# Patient Record
Sex: Male | Born: 1965 | Race: White | Hispanic: No | Marital: Married | Smoking: Never smoker
Health system: Southern US, Community
[De-identification: ages and names within clinical notes are randomized; demographics above are authoritative.]

---

## 2017-06-05 DIAGNOSIS — Z13220 Encounter for screening for lipoid disorders: Secondary | ICD-10-CM | POA: Diagnosis not present

## 2017-06-05 DIAGNOSIS — Z0000 Encounter for general adult medical examination without abnormal findings: Secondary | ICD-10-CM | POA: Diagnosis not present

## 2018-07-09 DIAGNOSIS — D225 Melanocytic nevi of trunk: Secondary | ICD-10-CM | POA: Diagnosis not present

## 2018-07-09 DIAGNOSIS — L711 Rhinophyma: Secondary | ICD-10-CM | POA: Diagnosis not present

## 2018-07-09 DIAGNOSIS — L723 Sebaceous cyst: Secondary | ICD-10-CM | POA: Diagnosis not present

## 2020-08-05 NOTE — Progress Notes (Signed)
Carl [name] [address] [place] Phone: [phone] Subjective:   I Carl [name] am serving as a Education administrator for Dr. Hulan [name].  This visit occurred during the SARS-CoV-2 public health emergency.  Safety protocols were in place, including screening questions prior to the visit, additional usage of staff PPE, and extensive cleaning of exam room while observing appropriate contact time as indicated for disinfecting solutions.   I'm seeing this patient by the request  of:  Carl Arabian, MD  CC: Low back pain  [id number]  Carl [name] is a 54 y.o. male coming in with complaint of low back pain with sciatica. Patient states he has left lower back pain. Is taking 300 mg of gabapentin 2 times a day. This past weekend he went to a wedding and states that Sunday he was in pain. Monday he could not move and moving his left leg caused sharp pain. Back is stiff. Sciatic pain is chronic. Bilateral but worse on the left. Has tried ice, bio freeze, Ibuprofen, aleve 220 2 times a day. 9/10 at its worse.       Social History   Socioeconomic History  . Marital status: Married    Spouse name: Not on file  . Number of children: Not on file  . Years of education: Not on file  . Highest education level: Not on file  Occupational History  . Not on file  Tobacco Use  . Smoking status: Not on file  Substance and Sexual Activity  . Alcohol use: Not on file  . Drug use: Not on file  . Sexual activity: Not on file  Other Topics Concern  . Not on file  Social History Narrative  . Not on file   Social Determinants of Health   Financial Resource Strain:   . Difficulty of Paying Living Expenses: Not on file  Food Insecurity:   . Worried About Charity fundraiser in the Last Year: Not on file  . Ran Out of Food in the Last Year: Not on file  Transportation Needs:   . Lack of Transportation (Medical): Not on file  . Lack of Transportation  (Non-Medical): Not on file  Physical Activity:   . Days of Exercise per Week: Not on file  . Minutes of Exercise per Session: Not on file  Stress:   . Feeling of Stress : Not on file  Social Connections:   . Frequency of Communication with Friends and Family: Not on file  . Frequency of Social Gatherings with Friends and Family: Not on file  . Attends Religious Services: Not on file  . Active Member of Clubs or Organizations: Not on file  . Attends Archivist Meetings: Not on file  . Marital Status: Not on file   Not on File No family history on file.       Current Outpatient Medications (Other):  .  tiZANidine (ZANAFLEX) 4 MG tablet, Take 1 tablet (4 mg total) by mouth Nightly for 10 days.   Reviewed prior external information including notes and imaging from  primary care provider As well as notes that were available from care everywhere and other healthcare systems.  Past medical history, social, surgical and family history all reviewed in electronic medical record.  No pertanent information unless stated regarding to the chief complaint.   Review of Systems:  No headache, visual changes, nausea, vomiting, diarrhea, constipation, dizziness, abdominal pain, skin rash, fevers, chills, night sweats, weight loss, swollen  lymph nodes, body aches, joint swelling, chest pain, shortness of breath, mood changes. POSITIVE muscle aches  Objective  Blood pressure 140/80, pulse 75, height 5\' 9"  (1.753 m), weight 181 lb (82.1 kg), SpO2 98 %.   General: No apparent distress alert and oriented x3 mood and affect normal, dressed appropriately.  HEENT: Pupils equal, extraocular movements intact  Respiratory: Patient's speak in full sentences and does not appear short of breath  Cardiovascular: No lower extremity edema, non tender, no erythema  Gait normal with good balance and coordination.  MSK:  Non tender with full range of motion and good stability and symmetric strength and  tone of shoulders, elbows, wrist, hip, knee and ankles bilaterally.  Low back exam shows the patient has significant loss of lordosis.  Moderate to severe tenderness to palpation over the left sacroiliac joint.  Positive Corky Sox.  Significant tightness noted Patient does not have any true radicular symptoms.  Patient does have limited flexion lacking the last 10 degrees.  Neurovascularly intact distally.  Deep tendon reflexes are intact.   Impression and Recommendations:     The above documentation has been reviewed and is accurate and complete Carl Pulley, DO

## 2020-08-06 ENCOUNTER — Ambulatory Visit (INDEPENDENT_AMBULATORY_CARE_PROVIDER_SITE_OTHER): Payer: 59 | Admitting: Family Medicine

## 2020-08-06 ENCOUNTER — Other Ambulatory Visit: Payer: Self-pay

## 2020-08-06 ENCOUNTER — Ambulatory Visit (INDEPENDENT_AMBULATORY_CARE_PROVIDER_SITE_OTHER): Payer: 59

## 2020-08-06 ENCOUNTER — Encounter: Payer: Self-pay | Admitting: Family Medicine

## 2020-08-06 DIAGNOSIS — M545 Low back pain, unspecified: Secondary | ICD-10-CM

## 2020-08-06 MED ORDER — TIZANIDINE HCL 4 MG PO TABS: 4.0000 mg | ORAL_TABLET | Freq: Every evening | ORAL | 2 refills | Status: AC

## 2020-08-06 MED ORDER — KETOROLAC TROMETHAMINE 30 MG/ML IJ SOLN
30.0000 mg | Freq: Once | INTRAMUSCULAR | Status: AC
  Administered 2020-08-06: 30 mg via INTRAMUSCULAR

## 2020-08-06 MED ORDER — METHYLPREDNISOLONE ACETATE 40 MG/ML IJ SUSP
40.0000 mg | Freq: Once | INTRAMUSCULAR | Status: AC
  Administered 2020-08-06: 40 mg via INTRAMUSCULAR

## 2020-08-06 NOTE — Patient Instructions (Addendum)
Good to see you Back xray 2 injections today and hold on the naproxen today  Ice 20 minutes 2 times daily. Usually after activity and before bed. OK to take the naproxen 2 times a day for another 3-5 days  zanaflex at night which is a muscle relaxer can start today  Exercises 3 times a week.   See me again in 4 weeks and we will discuss maniplation

## 2020-08-06 NOTE — Assessment & Plan Note (Addendum)
Lumbar pain.  Patient has more on the left side.  Seems to be associated with more the sacroiliac.  Discussed icing regimen and home exercises.  Discussed which activities to do which wants to avoid.  Toradol and Depo-Medrol given today.  Zanaflex given for nighttime plane.  X-rays are pending.  Would have attempted osteopathic manipulation but patient was significantly too tight today.  We will see how patient does in a follow-up will consider adding this to the regimen.  Patient will be traveling for a hunting trip tomorrow and we will see how patient responds

## 2020-08-17 ENCOUNTER — Encounter: Payer: Self-pay | Admitting: Family Medicine

## 2020-09-07 ENCOUNTER — Ambulatory Visit (INDEPENDENT_AMBULATORY_CARE_PROVIDER_SITE_OTHER): Payer: 59 | Admitting: Family Medicine

## 2020-09-07 ENCOUNTER — Encounter: Payer: Self-pay | Admitting: Family Medicine

## 2020-09-07 ENCOUNTER — Other Ambulatory Visit: Payer: Self-pay

## 2020-09-07 VITALS — BP 118/76 | HR 71 | Ht 69.0 in | Wt 181.0 lb

## 2020-09-07 DIAGNOSIS — M545 Low back pain, unspecified: Secondary | ICD-10-CM | POA: Diagnosis not present

## 2020-09-07 NOTE — Progress Notes (Signed)
Carl [name] Phone: [phone] Subjective:   Carl [name], am serving as a scribe for Dr. Hulan Saas. This visit occurred during the SARS-CoV-2 public health emergency.  Safety protocols were in place, including screening questions prior to the visit, additional usage of staff PPE, and extensive cleaning of exam room while observing appropriate contact time as indicated for disinfecting solutions.   I'm seeing this patient by the request  of:  Carl Arabian, MD  CC: Low back pain  [id number]   08/06/2020 Lumbar pain.  Patient has more on the left side.  Seems to be associated with more the sacroiliac.  Discussed icing regimen and home exercises.  Discussed which activities to do which wants to avoid.  Toradol and Depo-Medrol given today.  Zanaflex given for nighttime plane.  X-rays are pending.  Would have attempted osteopathic manipulation but patient was significantly too tight today.  We will see how patient does in a follow-up will consider adding this to the regimen.  Patient will be traveling for a hunting trip tomorrow and we will see how patient responds    Update 09/07/2020 Carl [name] is a 54 y.o. male coming in with complaint of back pain. Patient states that he has stiffness in the mornings. Experiences nerve pain in the mornings L>R. Taking gabapentin and Advil. If he bikes in the morning this will reduce his pain. Pain in left SI has improved.  Patient though is accompanied with wife who does state that unfortunately continues to have the chronic pain.  Patient states continues to have the radicular symptoms.  States that he does not know of the areas any significant improvement with the radicular symptoms or his wife states.    [name] past medical history on file. [name] past surgical history on file. Social History   Socioeconomic History  . Marital status: Married    Spouse name: Not on file  .  Number of children: Not on file  . Years of education: Not on file  . Highest education level: Not on file  Occupational History  . Not on file  Tobacco Use  . Smoking status: Not on file  . Smokeless tobacco: Not on file  Substance and Sexual Activity  . Alcohol use: Not on file  . Drug use: Not on file  . Sexual activity: Not on file  Other Topics Concern  . Not on file  Social History Narrative  . Not on file   Social Determinants of Health   Financial Resource Strain: Not on file  Food Insecurity: Not on file  Transportation Needs: Not on file  Physical Activity: Not on file  Stress: Not on file  Social Connections: Not on file   Not on File [name] family history on file.       Current Outpatient Medications (Other):  .  gabapentin (NEURONTIN) 300 MG capsule, Take 300 mg by mouth 2 (two) times daily.   Reviewed prior external information including notes and imaging from  primary care provider As well as notes that were available from care everywhere and other healthcare systems.  Past medical history, social, surgical and family history all reviewed in electronic medical record.  [name] unless stated regarding to the chief complaint.   Review of Systems:  [name] headache, visual changes, nausea, vomiting, diarrhea, constipation, dizziness, abdominal pain, skin rash, fevers, chills, night sweats, weight loss, swollen lymph nodes, , joint swelling, chest pain, shortness of breath,  mood changes. POSITIVE muscle aches, body aches  Objective  Blood pressure 118/76, pulse 71, height 5\' 9"  (1.753 m), weight 181 lb (82.1 kg), SpO2 98 %.   General: [name] apparent distress alert and oriented x3 mood and affect normal, dressed appropriately.  HEENT: Pupils equal, extraocular movements intact  Respiratory: Patient's speak in full sentences and does not appear short of breath  Cardiovascular: [name] lower extremity edema, non tender, [name] erythema  Neuro: Cranial nerves  II through XII are intact, neurovascularly intact in all extremities with 2+ DTRs and 2+ pulses.  Gait normal with good balance and coordination.  MSK:  Non tender with full range of motion and good stability and symmetric strength and tone of shoulders, elbows, wrist, hip, knee and ankles bilaterally.  Low back exam does show the patient does have loss of lordosis.  Mild positive straight leg test.  Patient does have radicular symptoms in the S1 distribution left side.  This is at 20 degrees of forward flexion.  Patient strength may be some mild weakness with dorsi flexion on the left compared to the right.  Deep tendon reflexes though are intact.    Impression and Recommendations:     The above documentation has been reviewed and is accurate and complete Lyndal Pulley, DO

## 2020-09-07 NOTE — Assessment & Plan Note (Addendum)
Patient states initially that he was making some improvement but patient wife at bedside states that he is still having significant aggravating factors.  Patient states that unfortunately continues to have the numbness with patient on the gabapentin 300 mg twice a day.  Encouraged him to consider trying to increase to 300 mg 3 times a day.  At this point they would be willing to do an MRI secondary to the radicular symptoms and mild weakness noted today.  Depending on MRI patient could be a candidate for possible epidurals.  Patient's x-rays do show mild to moderate degenerative disc disease at L4-L5 with mild anterior listhesis.  Depending on findings from his chronic problem with exacerbation we would consider the possibility of epidurals or consider formal physical therapy and/or manipulation if unremarkable otherwise.

## 2020-09-07 NOTE — Patient Instructions (Addendum)
Good to see you!  You should be hearing from [facility] to schedule MRI.  See me again in after MRI.

## 2020-09-14 ENCOUNTER — Other Ambulatory Visit: Payer: Self-pay

## 2020-09-14 ENCOUNTER — Ambulatory Visit (INDEPENDENT_AMBULATORY_CARE_PROVIDER_SITE_OTHER): Payer: 59

## 2020-09-14 DIAGNOSIS — M4316 Spondylolisthesis, lumbar region: Secondary | ICD-10-CM | POA: Diagnosis not present

## 2020-09-14 DIAGNOSIS — M5126 Other intervertebral disc displacement, lumbar region: Secondary | ICD-10-CM | POA: Diagnosis not present

## 2020-09-14 DIAGNOSIS — M48061 Spinal stenosis, lumbar region without neurogenic claudication: Secondary | ICD-10-CM

## 2020-09-14 DIAGNOSIS — M545 Low back pain, unspecified: Secondary | ICD-10-CM

## 2020-09-15 ENCOUNTER — Other Ambulatory Visit: Payer: Self-pay

## 2020-09-15 ENCOUNTER — Encounter: Payer: Self-pay | Admitting: Family Medicine

## 2020-09-15 DIAGNOSIS — M545 Low back pain, unspecified: Secondary | ICD-10-CM

## 2020-09-21 ENCOUNTER — Other Ambulatory Visit: Payer: Self-pay

## 2020-09-21 ENCOUNTER — Ambulatory Visit
Admission: RE | Admit: 2020-09-21 | Discharge: 2020-09-21 | Disposition: A | Discharge status: Home / Self Care | Payer: 59 | Source: Ambulatory Visit | Attending: Family Medicine | Admitting: Family Medicine

## 2020-09-21 ENCOUNTER — Other Ambulatory Visit: Payer: Self-pay | Admitting: Family Medicine

## 2020-09-21 DIAGNOSIS — M545 Low back pain, unspecified: Secondary | ICD-10-CM

## 2020-09-21 MED ORDER — IOPAMIDOL (ISOVUE-M 200) INJECTION 41%
1.0000 mL | Freq: Once | INTRAMUSCULAR | Status: AC
  Administered 2020-09-21: 1 mL via EPIDURAL

## 2020-09-21 MED ORDER — METHYLPREDNISOLONE ACETATE 40 MG/ML INJ SUSP (RADIOLOG
120.0000 mg | Freq: Once | INTRAMUSCULAR | Status: AC
  Administered 2020-09-21: 120 mg via EPIDURAL

## 2020-09-21 NOTE — Discharge Instructions (Signed)

## 2020-10-16 NOTE — Progress Notes (Signed)
[place] Phone: [phone] Subjective:   [name], am serving as a scribe for Dr. Hulan Saas. This visit occurred during the SARS-CoV-2 public health emergency.  Safety protocols were in place, including screening questions prior to the visit, additional usage of staff PPE, and extensive cleaning of exam room while observing appropriate contact time as indicated for disinfecting solutions.   I'm seeing this patient by the request  of:  Gaynelle Arabian, MD  CC: Back pain follow-up  [id number]   09/07/2020 Patient states initially that he was making some improvement but patient wife at bedside states that he is still having significant aggravating factors.  Patient states that unfortunately continues to have the numbness with patient on the gabapentin 300 mg twice a day.  Encouraged him to consider trying to increase to 300 mg 3 times a day.  At this point they would be willing to do an MRI secondary to the radicular symptoms and mild weakness noted today.  Depending on MRI patient could be a candidate for possible epidurals.  Patient's x-rays do show mild to moderate degenerative disc disease at L4-L5 with mild anterior listhesis.  Depending on findings from his chronic problem with exacerbation we would consider the possibility of epidurals or consider formal physical therapy and/or manipulation if unremarkable otherwise.  Update 10/16/2020 Carl [name] is a 55 y.o. male coming in with complaint of low back pain. Epidural 07/22/2020. Patient states that his pain did improve for 2 weeks. He was at 75% better. Did go golfing last weekend and his pain returned to where it was before the epidural. Had to shovel some snow and this increased his pain as well. Patient states that he is overall better than when we first saw him. Does use gabapentin at night.        No past medical history on file. No past surgical  history on file. Social History   Socioeconomic History  . Marital status: Married    Spouse name: Not on file  . Number of children: Not on file  . Years of education: Not on file  . Highest education level: Not on file  Occupational History  . Not on file  Tobacco Use  . Smoking status: Not on file  . Smokeless tobacco: Not on file  Substance and Sexual Activity  . Alcohol use: Not on file  . Drug use: Not on file  . Sexual activity: Not on file  Other Topics Concern  . Not on file  Social History Narrative  . Not on file   Social Determinants of Health   Financial Resource Strain: Not on file  Food Insecurity: Not on file  Transportation Needs: Not on file  Physical Activity: Not on file  Stress: Not on file  Social Connections: Not on file   Not on File No family history on file.       Current Outpatient Medications (Other):  .  gabapentin (NEURONTIN) 300 MG capsule, Take 300 mg by mouth 2 (two) times daily.   Reviewed prior external information including notes and imaging from  primary care provider As well as notes that were available from care everywhere and other healthcare systems.  Past medical history, social, surgical and family history all reviewed in electronic medical record.  No pertanent information unless stated regarding to the chief complaint.   Review of Systems:  No headache, visual changes, nausea, vomiting, diarrhea, constipation, dizziness, abdominal pain, skin  rash, fevers, chills, night sweats, weight loss, swollen lymph nodes, body aches, joint swelling, chest pain, shortness of breath, mood changes. POSITIVE muscle aches  Objective  Blood pressure 130/88, pulse 70, height 5\' 9"  (1.753 m), weight 185 lb (83.9 kg), SpO2 99 %.   General: No apparent distress alert and oriented x3 mood and affect normal, dressed appropriately.  HEENT: Pupils equal, extraocular movements intact  Respiratory: Patient's speak in full sentences and does  not appear short of breath  Cardiovascular: No lower extremity edema, non tender, no erythema  Gait normal with good balance and coordination.  MSK:  Non tender with full range of motion and good stability and symmetric strength and tone of shoulders, elbows, wrist, hip, knee and ankles bilaterally.  Back exam shows the patient does have some mild loss of lordosis.  Some tenderness to palpation of the paraspinal musculature of the lumbar spine.  Patient does have tightness with straight leg test but no true radicular symptoms today.  Patient is neurovascularly intact distally.  He does appear to be somewhat uncomfortable.   Impression and Recommendations:     The above documentation has been reviewed and is accurate and complete Lyndal Pulley, DO

## 2020-10-19 ENCOUNTER — Other Ambulatory Visit: Payer: Self-pay

## 2020-10-19 ENCOUNTER — Encounter: Payer: Self-pay | Admitting: Family Medicine

## 2020-10-19 ENCOUNTER — Ambulatory Visit (INDEPENDENT_AMBULATORY_CARE_PROVIDER_SITE_OTHER): Payer: 59 | Admitting: Family Medicine

## 2020-10-19 VITALS — BP 130/88 | HR 70 | Ht 69.0 in | Wt 185.0 lb

## 2020-10-19 DIAGNOSIS — M545 Low back pain, unspecified: Secondary | ICD-10-CM | POA: Diagnosis not present

## 2020-10-19 NOTE — Patient Instructions (Signed)
Good to see you  We will order the epidural again at this time Stay active but give it 4-5 days after the injection  Increase golf to only 2 times a week after the injection for 1-2 weeks  See me again in 4-6 weeks after the injection

## 2020-10-19 NOTE — Assessment & Plan Note (Signed)
Patient does have MRI showing the patient does have moderate canal stenosis noted at L4-L5.  Patient did respond initially to the epidural by 75% initially.  Patient though is now having worsening pain again.  Has been over to nearly 3 months since the epidural and I do think that patient can respond.  Continuing to take the gabapentin fairly regularly.  Patient will have another epidural ordered at this time.  Hopefully patient will have a good response again.  Patient will follow up 4 to 6 weeks after the injection to further evaluate and discuss medical management.

## 2020-10-26 ENCOUNTER — Ambulatory Visit
Admission: RE | Admit: 2020-10-26 | Discharge: 2020-10-26 | Disposition: A | Discharge status: Home / Self Care | Payer: 59 | Source: Ambulatory Visit | Attending: Family Medicine | Admitting: Family Medicine

## 2020-10-26 ENCOUNTER — Other Ambulatory Visit: Payer: Self-pay

## 2020-10-26 DIAGNOSIS — M545 Low back pain, unspecified: Secondary | ICD-10-CM

## 2020-10-26 MED ORDER — METHYLPREDNISOLONE ACETATE 40 MG/ML INJ SUSP (RADIOLOG
120.0000 mg | Freq: Once | INTRAMUSCULAR | Status: AC
  Administered 2020-10-26: 120 mg via EPIDURAL

## 2020-10-26 MED ORDER — IOPAMIDOL (ISOVUE-M 200) INJECTION 41%
1.0000 mL | Freq: Once | INTRAMUSCULAR | Status: AC
  Administered 2020-10-26: 1 mL via EPIDURAL

## 2020-10-26 NOTE — Discharge Instructions (Signed)

## 2020-12-11 NOTE — Progress Notes (Signed)
[place] [address] [place] Phone: [phone] Subjective:   I Carl [name] am serving as a Education administrator for Dr. Hulan Saas.  This visit occurred during the SARS-CoV-2 public health emergency.  Safety protocols were in place, including screening questions prior to the visit, additional usage of staff PPE, and extensive cleaning of exam room while observing appropriate contact time as indicated for disinfecting solutions.   I'm seeing this patient by the request  of:  Gaynelle Arabian, MD  CC: Back pain follow-up  [id number]   10/19/2020 Patient does have MRI showing the patient does have moderate canal stenosis noted at L4-L5.  Patient did respond initially to the epidural by 75% initially.  Patient though is now having worsening pain again.  Has been over to nearly 3 months since the epidural and I do think that patient can respond.  Continuing to take the gabapentin fairly regularly.  Patient will have another epidural ordered at this time.  Hopefully patient will have a good response again.  Patient will follow up 4 to 6 weeks after the injection to further evaluate and discuss medical management.  Update 12/14/2020 Carl [name] is a 55 y.o. male coming in with complaint of lumbar spine pain. Patient states the first 4-5 weeks he had no pain. Last 2 weeks he has been doing more strenuous work and his back has started to bother him. Pain is not as bad as it was.  Patient states though that it is still more frustrating than truly painful at this time.  Wants to continue to be relatively active.  No concerns no at this point.  Patient did have an injection at the level above this time and did not feel like this made any better improvement with no pain.       No past medical history on file. No past surgical history on file. Social History   Socioeconomic History  . Marital status: Married    Spouse name: Not on file  . Number of  children: Not on file  . Years of education: Not on file  . Highest education level: Not on file  Occupational History  . Not on file  Tobacco Use  . Smoking status: Not on file  . Smokeless tobacco: Not on file  Substance and Sexual Activity  . Alcohol use: Not on file  . Drug use: Not on file  . Sexual activity: Not on file  Other Topics Concern  . Not on file  Social History Narrative  . Not on file   Social Determinants of Health   Financial Resource Strain: Not on file  Food Insecurity: Not on file  Transportation Needs: Not on file  Physical Activity: Not on file  Stress: Not on file  Social Connections: Not on file   Not on File No family history on file.       Current Outpatient Medications (Other):  .  gabapentin (NEURONTIN) 300 MG capsule, Take 300 mg by mouth 2 (two) times daily.   Reviewed prior external information including notes and imaging from  primary care provider As well as notes that were available from care everywhere and other healthcare systems.  Past medical history, social, surgical and family history all reviewed in electronic medical record.  No pertanent information unless stated regarding to the chief complaint.   Review of Systems:  No headache, visual changes, nausea, vomiting, diarrhea, constipation, dizziness, abdominal pain, skin rash, fevers, chills, night sweats, weight loss,  swollen lymph nodes, body aches, joint swelling, chest pain, shortness of breath, mood changes. POSITIVE muscle aches  Objective  Blood pressure 140/90, pulse 70, height 5\' 9"  (1.753 m), weight 182 lb (82.6 kg), SpO2 99 %.   General: No apparent distress alert and oriented x3 mood and affect normal, dressed appropriately.  HEENT: Pupils equal, extraocular movements intact  Respiratory: Patient's speak in full sentences and does not appear short of breath  Cardiovascular: No lower extremity edema, non tender, no erythema  Gait normal with good balance and  coordination.  MSK:  Low back exam does have some very mild loss of lordosis but is patient is sitting comfortably.  Patient was able to jump up from sitting with no significant discomfort and pain.    Impression and Recommendations:     The above documentation has been reviewed and is accurate and complete Lyndal Pulley, DO

## 2020-12-14 ENCOUNTER — Other Ambulatory Visit: Payer: Self-pay

## 2020-12-14 ENCOUNTER — Encounter: Payer: Self-pay | Admitting: Family Medicine

## 2020-12-14 ENCOUNTER — Ambulatory Visit (INDEPENDENT_AMBULATORY_CARE_PROVIDER_SITE_OTHER): Payer: 59 | Admitting: Family Medicine

## 2020-12-14 DIAGNOSIS — M545 Low back pain, unspecified: Secondary | ICD-10-CM

## 2020-12-14 NOTE — Patient Instructions (Signed)
Good to see you Glad you are feeling better Enjoy the beach Stretch after activity always Continue gabapentin IF at any point you want another epidural at L3-L4 let us know See me again in 2-3 months

## 2020-12-14 NOTE — Assessment & Plan Note (Signed)
Patient does have a moderate stenosis.  We discussed potentially needing the epidural again.  Patient though is doing relatively well with the gabapentin.  We discussed potential side effects of long-term use of this medication patient wants to hold on the injection at this point but will increase activity as tolerated.  Patient will follow up with me again in 3 months otherwise.  No significant changes at this time.

## 2021-01-05 ENCOUNTER — Encounter: Payer: Self-pay | Admitting: Family Medicine

## 2021-01-06 ENCOUNTER — Ambulatory Visit
Admission: RE | Admit: 2021-01-06 | Discharge: 2021-01-06 | Disposition: A | Discharge status: Home / Self Care | Payer: 59 | Source: Ambulatory Visit | Attending: Family Medicine | Admitting: Family Medicine

## 2021-01-06 ENCOUNTER — Other Ambulatory Visit: Payer: Self-pay

## 2021-01-06 DIAGNOSIS — M5416 Radiculopathy, lumbar region: Secondary | ICD-10-CM

## 2021-01-06 DIAGNOSIS — M5430 Sciatica, unspecified side: Secondary | ICD-10-CM | POA: Insufficient documentation

## 2021-01-06 DIAGNOSIS — D696 Thrombocytopenia, unspecified: Secondary | ICD-10-CM | POA: Insufficient documentation

## 2021-01-06 MED ORDER — METHYLPREDNISOLONE ACETATE 40 MG/ML INJ SUSP (RADIOLOG
80.0000 mg | Freq: Once | INTRAMUSCULAR | Status: AC
  Administered 2021-01-06: 80 mg via EPIDURAL

## 2021-01-06 MED ORDER — IOPAMIDOL (ISOVUE-M 200) INJECTION 41%
1.0000 mL | Freq: Once | INTRAMUSCULAR | Status: AC
  Administered 2021-01-06: 1 mL via EPIDURAL

## 2021-01-06 NOTE — Discharge Instructions (Signed)

## 2021-01-15 IMAGING — MR MR LUMBAR SPINE W/O CM
4 of 5 series · 25 of 48 positions shown · non-contrast
Comparison: Prior radiograph from 08/06/2020.

CLINICAL DATA: Initial evaluation for low back pain with bilateral
sciatica to the knees for 2 years.

EXAM:
MRI LUMBAR SPINE WITHOUT CONTRAST
TECHNIQUE: Multiplanar, multisequence MR imaging of the lumbar spine was
performed. No intravenous contrast was administered.

[Series 3: T2 · sagittal · 4.0mm · 0.81mm/px · 6 of 15 slices shown (1 of 2)]
[im 1/15]
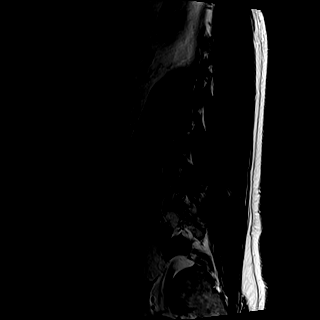
[im 3/15]
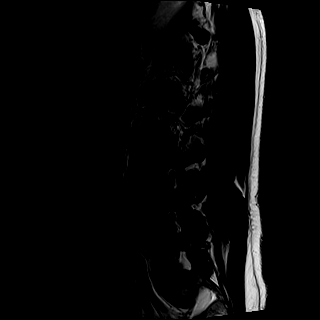
[im 6/15]
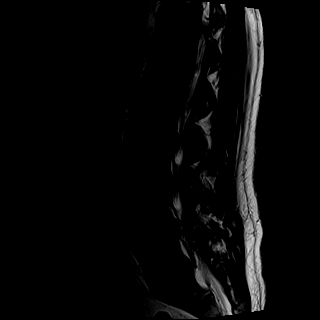
[im 9/15]
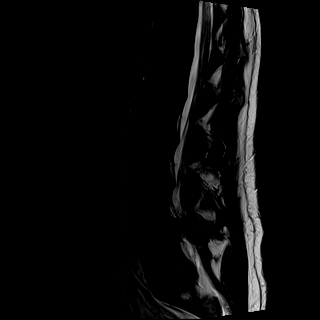
[im 12/15]
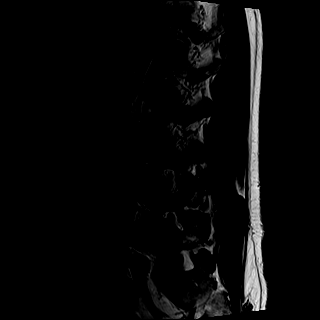
[im 15/15]
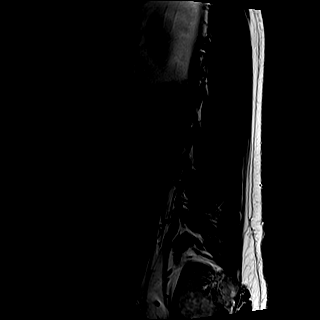

[Series 4: T1 · sagittal · 4.0mm · 0.41mm/px · 6 of 15 slices shown (1 of 2)]
[im 1/15]
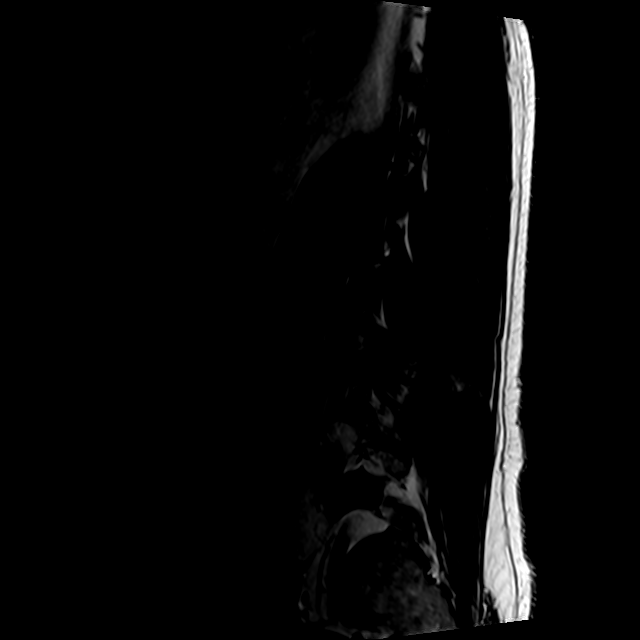
[im 3/15]
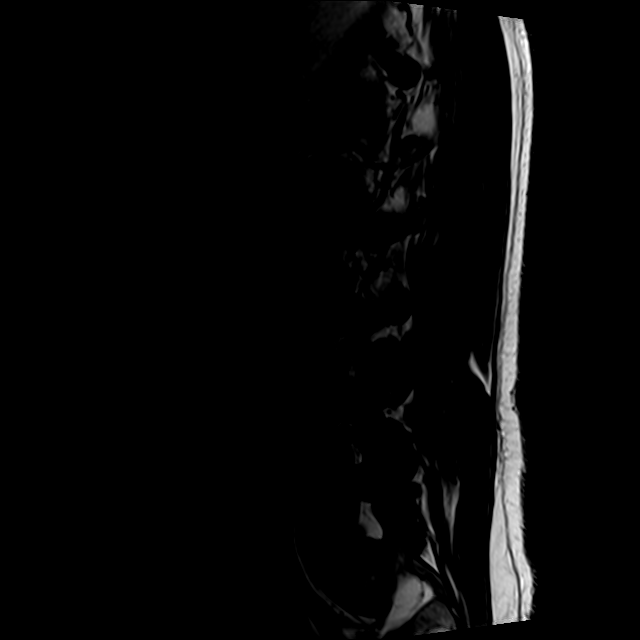
[im 6/15]
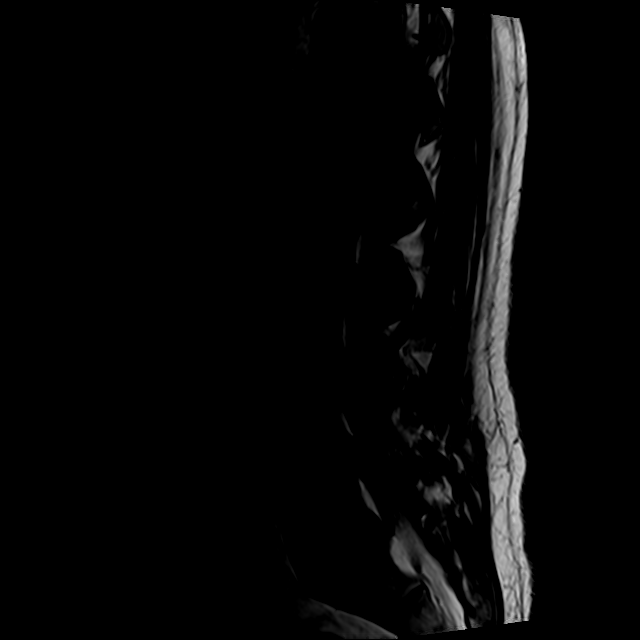
[im 9/15]
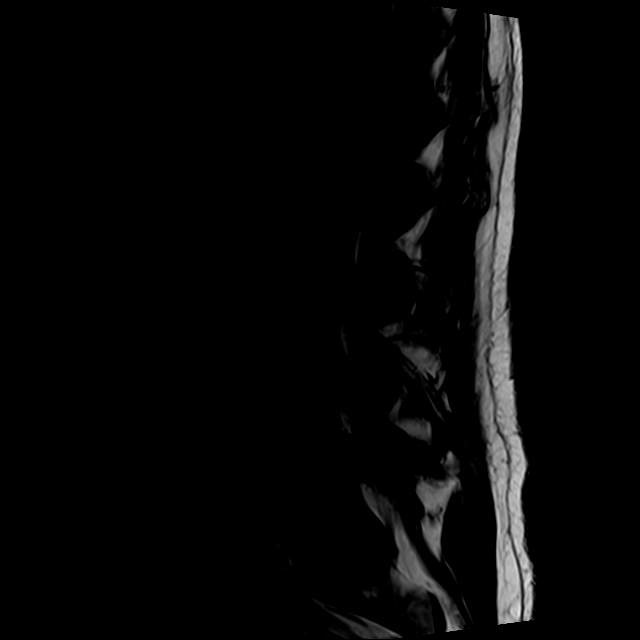
[im 12/15]
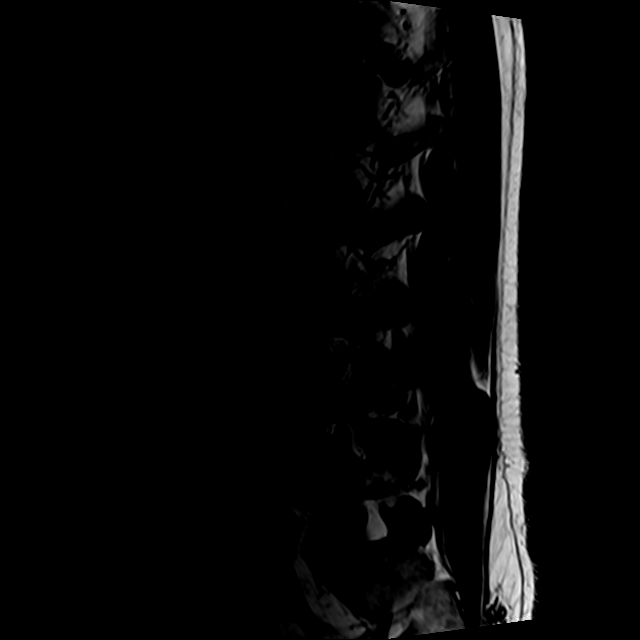
[im 15/15]
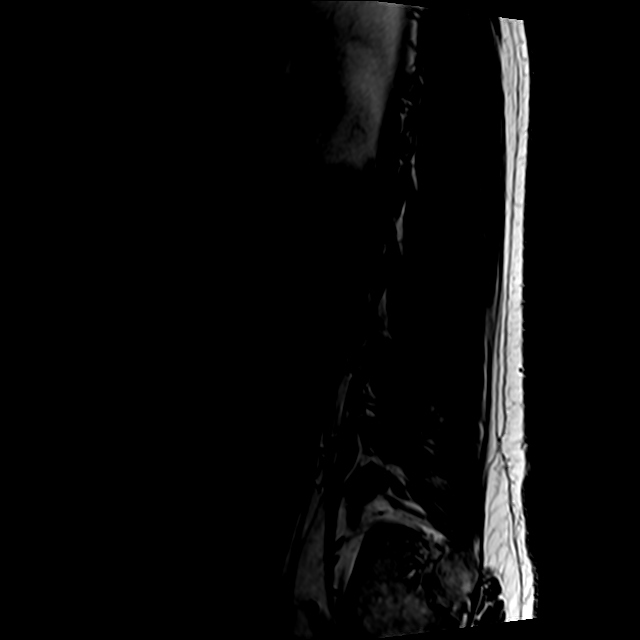

[Series 6: T2 · axial · 4.0mm · 0.78mm/px · z∈[-156,+73]mm · 9 of 41 slices shown (2 of 2)]
[im 1/41]
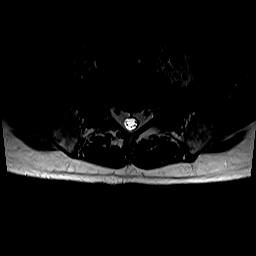
[im 6/41]
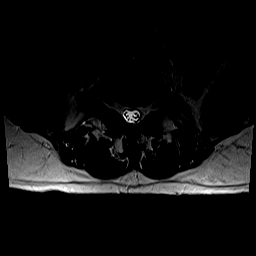
[im 12/41]
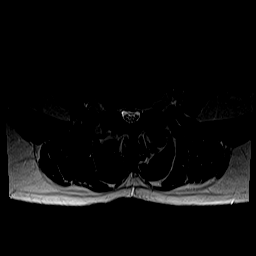
[im 18/41]
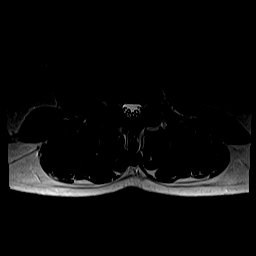
[im 21/41]
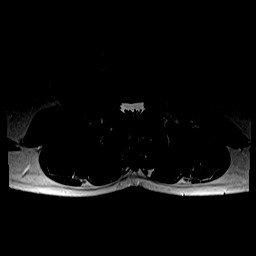
[im 23/41]
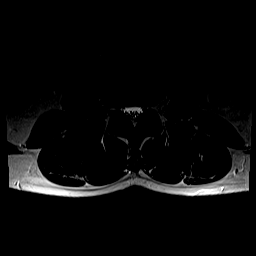
[im 29/41]
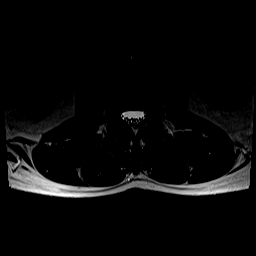
[im 35/41]
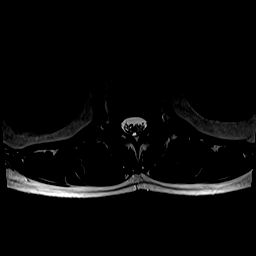
[im 41/41]
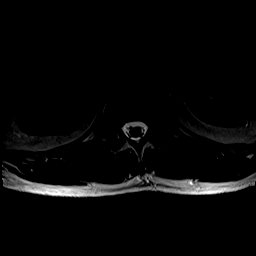

[Series 7: T1 · axial · 4.0mm · 0.39mm/px · z∈[-156,+43]mm · 4 of 41 slices shown (2 of 2)]
[im 1/41]
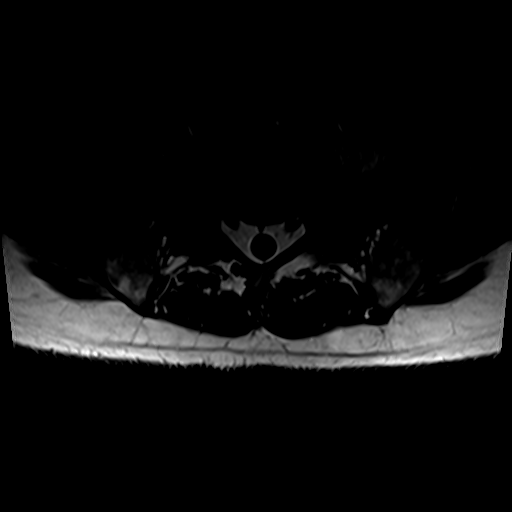
[im 6/41]
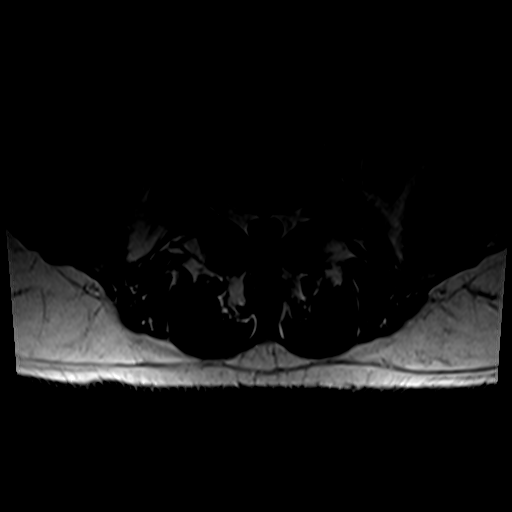
[im 21/41]
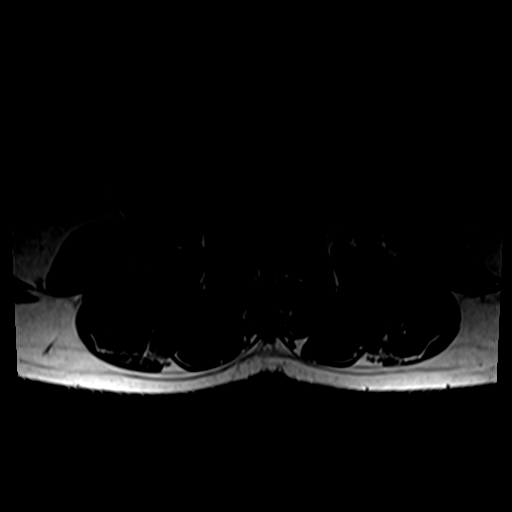
[im 35/41]
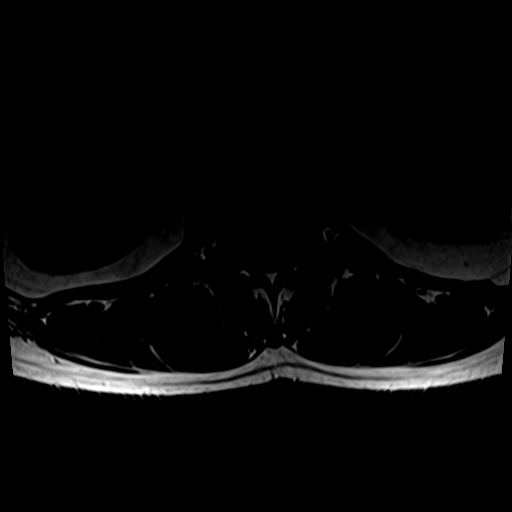

[25 of 48 positions shown; findings below may reference images not displayed]

FINDINGS: Segmentation: Transitional lumbosacral anatomy. For the purposes of
this dictation, lowest rib-bearing vertebral body is labeled T12.
There is partial sacralization of the L5 vertebral body.

Alignment: Trace 2 mm retrolisthesis of L1 on L2, L2 on L3, and L3
on L4, with 3 mm anterolisthesis of L4 on L5. Findings are chronic
and facet mediated. Mild straightening of the normal underlying
lumbar lordosis.

Vertebrae: Vertebral body height maintained without acute or chronic
fracture. Bone marrow signal intensity within normal limits. Few
scattered benign hemangiomata noted. No worrisome osseous lesions.
No abnormal marrow edema.

Conus medullaris and cauda equina: Conus extends to the L1 level.
Conus and cauda equina appear normal.

Paraspinal and other soft tissues: Unremarkable.

Disc levels:

T12-L1: Unremarkable.

L1-2: Trace retrolisthesis. Mild disc bulge with disc desiccation
and intervertebral disc space narrowing. Minimal facet hypertrophy.
No significant spinal stenosis. Foramina remain patent.

L2-3: Trace retrolisthesis. Mild diffuse disc bulge with disc
desiccation and intervertebral disc space narrowing. Mild facet
hypertrophy. No significant spinal stenosis. Foramina remain patent.

L3-4: Trace retrolisthesis. Mild diffuse disc bulge with disc
desiccation and intervertebral disc space narrowing. Superimposed
subtle right foraminal to extraforaminal disc protrusion closely
approximates the exiting right L3 nerve root as it courses of the
right neural foramen (series 7, image 26). Mild to moderate facet
hypertrophy, greater on the right. Resultant mild narrowing of the
lateral recesses bilaterally. Mild bilateral L3 foraminal stenosis.

L4-5: Trace anterolisthesis. Mild diffuse disc bulge with disc
desiccation and intervertebral disc space narrowing. Moderate facet
and ligament flavum hypertrophy with associated trace joint
effusions, greater on the left. Resultant moderate canal with
bilateral subarticular stenosis. Mild bilateral L4 foraminal
narrowing.

L5-S1: Transitional lumbosacral anatomy. No disc bulge or focal disc
herniation. No stenosis or neural impingement.
IMPRESSION: 1. Trace anterolisthesis of L4 on L5 with associated disc bulge and
moderate facet arthropathy, resulting in moderate canal and
bilateral subarticular stenosis.
2. Subtle right foraminal to extraforaminal disc protrusion at L3-4,
closely approximating and potentially irritating the exiting right
L3 nerve root.
3. Additional mild noncompressive disc bulging at L1-2 and L2-3
without significant stenosis or neural impingement.
4. Transitional lumbosacral anatomy. Careful correlation with
numbering system on this exam prior to any potential future
intervention.

## 2021-02-09 ENCOUNTER — Encounter: Payer: Self-pay | Admitting: Family Medicine

## 2021-02-10 ENCOUNTER — Other Ambulatory Visit: Payer: Self-pay

## 2021-02-10 MED ORDER — GABAPENTIN 300 MG PO CAPS: 300.0000 mg | ORAL_CAPSULE | Freq: Three times a day (TID) | ORAL | 0 refills | Status: DC

## 2021-02-17 ENCOUNTER — Ambulatory Visit: Payer: 59 | Admitting: Family Medicine

## 2021-03-22 ENCOUNTER — Ambulatory Visit: Payer: 59 | Admitting: Family Medicine

## 2021-03-24 NOTE — Progress Notes (Signed)
[facility] [address] [place] Phone: [phone] Subjective:   I Carl [name] am serving as a Education administrator for Dr. Hulan Saas.  This visit occurred during the SARS-CoV-2 public health emergency.  Safety protocols were in place, including screening questions prior to the visit, additional usage of staff PPE, and extensive cleaning of exam room while observing appropriate contact time as indicated for disinfecting solutions.   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  We discussed potentially needing the epidural again.  Patient though is doing relatively well with the gabapentin.  We discussed potential side effects of long-term use of this medication patient wants to hold on the injection at this point but will increase activity as tolerated.  Patient will follow up with me again in 3 months otherwise.  No significant changes at this time.  Update 03/25/2021 Carl [name] is a 55 y.o. male coming in with complaint of lumbar spine pain. Patient states his back is in pain. Still has some discomfort. After about 6-7 weeks he started having pain again.  Patient undergone a L3-L4 interlaminal body injection.  Patient had this done and was feeling better for the duration above.  Patient unfortunately states that still gives him some discomfort.  Patient states that as long as he does the stretches, takes the medications, home exercises he does relatively well and can tolerated.  Patient states sometimes when he does not do it though he has increasing discomfort and pain.      No past medical history on file. No past surgical history on file. Social History   Socioeconomic History   Marital status: Married    Spouse name: Not on file   Number of children: Not on file   Years of education: Not on file   Highest education level: Not  on file  Occupational History   Not on file  Tobacco Use   Smoking status: Not on file   Smokeless tobacco: Not on file  Substance and Sexual Activity   Alcohol use: Not on file   Drug use: Not on file   Sexual activity: Not on file  Other Topics Concern   Not on file  Social History Narrative   Not on file   Social Determinants of Health   Financial Resource Strain: Not on file  Food Insecurity: Not on file  Transportation Needs: Not on file  Physical Activity: Not on file  Stress: Not on file  Social Connections: Not on file   No Known Allergies No family history on file.       Current Outpatient Medications (Other):    gabapentin (NEURONTIN) 300 MG capsule, Take 1 capsule (300 mg total) by mouth 3 (three) times daily.   Reviewed prior external information including notes and imaging from  primary care provider As well as notes that were available from care everywhere and other healthcare systems.  Past medical history, social, surgical and family history all reviewed in electronic medical record.  No pertanent information unless stated regarding to the chief complaint.   Review of Systems:  No headache, visual changes, nausea, vomiting, diarrhea, constipation, dizziness, abdominal pain, skin rash, fevers, chills, night sweats, weight loss, swollen lymph nodes, body aches, joint swelling, chest pain, shortness of breath, mood changes. POSITIVE muscle aches  Objective  Blood pressure 118/80, pulse 64, height 5\' 9"  (1.753  m), weight 174 lb (78.9 kg), SpO2 99 %.   General: No apparent distress alert and oriented x3 mood and affect normal, dressed appropriately.  HEENT: Pupils equal, extraocular movements intact  Respiratory: Patient's speak in full sentences and does not appear short of breath  Cardiovascular: No lower extremity edema, non tender, no erythema  Gait normal with good balance and coordination.  MSK: Patient does have some loss of lordosis.  Seems  comfortable though sitting.    Impression and Recommendations:     The above documentation has been reviewed and is accurate and complete Carl Pulley, DO

## 2021-03-25 ENCOUNTER — Ambulatory Visit (INDEPENDENT_AMBULATORY_CARE_PROVIDER_SITE_OTHER): Payer: 59 | Admitting: Family Medicine

## 2021-03-25 ENCOUNTER — Encounter: Payer: Self-pay | Admitting: Family Medicine

## 2021-03-25 ENCOUNTER — Other Ambulatory Visit: Payer: Self-pay

## 2021-03-25 DIAGNOSIS — M545 Low back pain, unspecified: Secondary | ICD-10-CM

## 2021-03-25 NOTE — Assessment & Plan Note (Signed)
Patient had the MRI showing the patient did have any nerve root impingement and has responded fairly well to 2 interlaminar injections at the L3-L4 level.  Patient states that when he does get better it is approximately 80 to 90% better.  Discussed with him about the possibility of radiofrequency ablation.  Patient wants to avoid surgical intervention.  At this point we will continue with the conservative therapy as well as the gabapentin.  Patient will follow up with me again in 2 months or will write me sooner if he decides he would like to go through with the radiofrequency ablation.

## 2021-03-25 NOTE — Patient Instructions (Addendum)
Good to see you I am glad you are better Lets keep watching it and you keep working at it.  Can consider the radiofrequency ablation if needed See me again in 2 months if you need me

## 2021-05-28 ENCOUNTER — Ambulatory Visit: Payer: 59 | Admitting: Family Medicine

## 2021-08-06 ENCOUNTER — Encounter: Payer: Self-pay | Admitting: Family Medicine

## 2021-10-27 NOTE — Progress Notes (Deleted)
°  [place] Phone: [phone] Subjective:    I'm seeing this patient by the request  of:  Gaynelle Arabian, MD  CC:   [id number]  03/25/2021 Patient had the MRI showing the patient did have any nerve root impingement and has responded fairly well to 2 interlaminar injections at the L3-L4 level.  Patient states that when he does get better it is approximately 80 to 90% better.  Discussed with him about the possibility of radiofrequency ablation.  Patient wants to avoid surgical intervention.  At this point we will continue with the conservative therapy as well as the gabapentin.  Patient will follow up with me again in 2 months or will write me sooner if he decides he would like to go through with the radiofrequency ablation.  Updated 10/28/2021 Carl [name] is a 56 y.o. male coming in with complaint of back pain       No past medical history on file. No past surgical history on file. Social History   Socioeconomic History   Marital status: Married    Spouse name: Not on file   Number of children: Not on file   Years of education: Not on file   Highest education level: Not on file  Occupational History   Not on file  Tobacco Use   Smoking status: Not on file   Smokeless tobacco: Not on file  Substance and Sexual Activity   Alcohol use: Not on file   Drug use: Not on file   Sexual activity: Not on file  Other Topics Concern   Not on file  Social History Narrative   Not on file   Social Determinants of Health   Financial Resource Strain: Not on file  Food Insecurity: Not on file  Transportation Needs: Not on file  Physical Activity: Not on file  Stress: Not on file  Social Connections: Not on file   No Known Allergies No family history on file.       Current Outpatient Medications (Other):    gabapentin (NEURONTIN) 300 MG capsule, Take 1 capsule (300 mg total) by mouth 3 (three) times  daily.   Reviewed prior external information including notes and imaging from  primary care provider As well as notes that were available from care everywhere and other healthcare systems.  Past medical history, social, surgical and family history all reviewed in electronic medical record.  No pertanent information unless stated regarding to the chief complaint.   Review of Systems:  No headache, visual changes, nausea, vomiting, diarrhea, constipation, dizziness, abdominal pain, skin rash, fevers, chills, night sweats, weight loss, swollen lymph nodes, body aches, joint swelling, chest pain, shortness of breath, mood changes. POSITIVE muscle aches  Objective  There were no vitals taken for this visit.   General: No apparent distress alert and oriented x3 mood and affect normal, dressed appropriately.  HEENT: Pupils equal, extraocular movements intact  Respiratory: Patient's speak in full sentences and does not appear short of breath  Cardiovascular: No lower extremity edema, non tender, no erythema  Gait normal with good balance and coordination.  MSK:  Non tender with full range of motion and good stability and symmetric strength and tone of shoulders, elbows, wrist, hip, knee and ankles bilaterally.     Impression and Recommendations:     The above documentation has been reviewed and is accurate and complete Delsa Sale

## 2021-10-28 ENCOUNTER — Ambulatory Visit: Payer: BC Managed Care – PPO | Admitting: Family Medicine

## 2021-11-27 DIAGNOSIS — H938X2 Other specified disorders of left ear: Secondary | ICD-10-CM | POA: Diagnosis not present

## 2022-04-25 DIAGNOSIS — H5789 Other specified disorders of eye and adnexa: Secondary | ICD-10-CM | POA: Diagnosis not present

## 2022-04-25 DIAGNOSIS — R220 Localized swelling, mass and lump, head: Secondary | ICD-10-CM | POA: Diagnosis not present

## 2022-06-28 DIAGNOSIS — D696 Thrombocytopenia, unspecified: Secondary | ICD-10-CM | POA: Diagnosis not present

## 2022-06-28 DIAGNOSIS — Z23 Encounter for immunization: Secondary | ICD-10-CM | POA: Diagnosis not present

## 2022-06-28 DIAGNOSIS — Z131 Encounter for screening for diabetes mellitus: Secondary | ICD-10-CM | POA: Diagnosis not present

## 2022-06-28 DIAGNOSIS — Z0000 Encounter for general adult medical examination without abnormal findings: Secondary | ICD-10-CM | POA: Diagnosis not present

## 2022-10-12 ENCOUNTER — Ambulatory Visit (INDEPENDENT_AMBULATORY_CARE_PROVIDER_SITE_OTHER): Payer: BC Managed Care – PPO | Admitting: Family Medicine

## 2022-10-12 VITALS — BP 132/84 | HR 76 | Ht 69.0 in | Wt 173.0 lb

## 2022-10-12 DIAGNOSIS — M545 Low back pain, unspecified: Secondary | ICD-10-CM

## 2022-10-12 MED ORDER — TIZANIDINE HCL 4 MG PO TABS: 4.0000 mg | ORAL_TABLET | Freq: Three times a day (TID) | ORAL | 1 refills | Status: AC | PRN

## 2022-10-12 NOTE — Patient Instructions (Signed)
Thank you for coming in today.   I've referred you to Physical Therapy.  Let us know if you don't hear from them in one week.   Tizinadine muscle relaxer.   Heating Pad.   TENS UNIT: This is helpful for muscle pain and spasm.   Search and Purchase a TENS 7000 2nd edition at  www.tenspros.com or www.[employer].com It should be less than $30.     TENS unit instructions: Do not shower or bathe with the unit on Turn the unit off before removing electrodes or batteries If the electrodes lose stickiness add a drop of water to the electrodes after they are disconnected from the unit and place on plastic sheet. If you continued to have difficulty, call the TENS unit company to purchase more electrodes. Do not apply lotion on the skin area prior to use. Make sure the skin is clean and dry as this will help prolong the life of the electrodes. After use, always check skin for unusual red areas, rash or other skin difficulties. If there are any skin problems, does not apply electrodes to the same area. Never remove the electrodes from the unit by pulling the wires. Do not use the TENS unit or electrodes other than as directed. Do not change electrode placement without consultating your therapist or physician. Keep 2 fingers with between each electrode. Wear time ratio is 2:1, on to off times.    For example on for 30 minutes off for 15 minutes and then on for 30 minutes off for 15 minutes   Percussive Massager can help too.   Let me know if this is not working.

## 2022-10-12 NOTE — Progress Notes (Signed)
I, Peterson Lombard, LAT, ATC acting as a scribe for Lynne Leader, MD.  Carl [name] is a 57 y.o. male who presents to [place] at [facility] today for re-exacerbation of his LBP. Pt was previously seen by Dr. Tamala Julian on 03/25/21 and has had 3 prior ESI, most recent on 01/06/21. Today, pt reports he was working on Monday, using kettlebells, and LBP developed. Pt locates pain to the midline of his low back. Pt c/o stiffness  Radiating pain: no LE numbness/tingling: no LE weakness: slight Aggravates: too acute to determine Treatments tried: ice, heat, IBU, muscle relaxers  Dx imaging: 09/14/2020 L-spine MRI  08/06/2020 L-spine x-ray  Pertinent review of systems: No fevers or chills  Relevant historical information: History of prior sciatica   Exam:  BP 132/84   Pulse 76   Ht '5\' 9"'$  (1.753 m)   Wt 173 lb (78.5 kg)   SpO2 98%   BMI 25.55 kg/m  General: Well Developed, well nourished, and in no acute distress.   MSK: L-spine: Normal. Tender palpation left lumbar paraspinal musculature. Decreased lumbar motion pain with extension and flexion. Lower extremity strength reflexes and sensation are intact and equal bilaterally.    Lab and Radiology Results  EXAM: MRI LUMBAR SPINE WITHOUT CONTRAST   TECHNIQUE: Multiplanar, multisequence MR imaging of the lumbar spine was performed. No intravenous contrast was administered.   COMPARISON:  Prior radiograph from 08/06/2020.   FINDINGS: Segmentation: Transitional lumbosacral anatomy. For the purposes of this dictation, lowest rib-bearing vertebral body is labeled T12. There is partial sacralization of the L5 vertebral body.   Alignment: Trace 2 mm retrolisthesis of L1 on L2, L2 on L3, and L3 on L4, with 3 mm anterolisthesis of L4 on L5. Findings are chronic and facet mediated. Mild straightening of the normal underlying lumbar lordosis.   Vertebrae: Vertebral body height maintained without acute or  chronic fracture. Bone marrow signal intensity within normal limits. Few scattered benign hemangiomata noted. No worrisome osseous lesions. No abnormal marrow edema.   Conus medullaris and cauda equina: Conus extends to the L1 level. Conus and cauda equina appear normal.   Paraspinal and other soft tissues: Unremarkable.   Disc levels:   T12-L1: Unremarkable.   L1-2: Trace retrolisthesis. Mild disc bulge with disc desiccation and intervertebral disc space narrowing. Minimal facet hypertrophy. No significant spinal stenosis. Foramina remain patent.   L2-3: Trace retrolisthesis. Mild diffuse disc bulge with disc desiccation and intervertebral disc space narrowing. Mild facet hypertrophy. No significant spinal stenosis. Foramina remain patent.   L3-4: Trace retrolisthesis. Mild diffuse disc bulge with disc desiccation and intervertebral disc space narrowing. Superimposed subtle right foraminal to extraforaminal disc protrusion closely approximates the exiting right L3 nerve root as it courses of the right neural foramen (series 7, image 26). Mild to moderate facet hypertrophy, greater on the right. Resultant mild narrowing of the lateral recesses bilaterally. Mild bilateral L3 foraminal stenosis.   L4-5: Trace anterolisthesis. Mild diffuse disc bulge with disc desiccation and intervertebral disc space narrowing. Moderate facet and ligament flavum hypertrophy with associated trace joint effusions, greater on the left. Resultant moderate canal with bilateral subarticular stenosis. Mild bilateral L4 foraminal narrowing.   L5-S1: Transitional lumbosacral anatomy. No disc bulge or focal disc herniation. No stenosis or neural impingement.   IMPRESSION: 1. Trace anterolisthesis of L4 on L5 with associated disc bulge and moderate facet arthropathy, resulting in moderate canal and bilateral subarticular stenosis. 2. Subtle right foraminal to extraforaminal disc protrusion at  L3-4, closely approximating and potentially irritating the exiting right L3 nerve root. 3. Additional mild noncompressive disc bulging at L1-2 and L2-3 without significant stenosis or neural impingement. 4. Transitional lumbosacral anatomy. Careful correlation with numbering system on this exam prior to any potential future intervention.     Electronically Signed   By: Jeannine Boga M.D.   On: 09/15/2020 03:38   The above documentation has been reviewed and is accurate and complete Lynne Leader, M.D.      Assessment and Plan: 57 y.o. male with acute exacerbation of chronic low back pain.  Today's pain is due to muscle spasm and dysfunction.  Fortunately he does not have recurrent sciatica symptoms.  Plan for heating pad TENS unit tizanidine and continue normal activity as tolerated.  Physical therapy should be quite helpful especially for core strength to prevent this from happening again in the future.  Referral placed for physical therapy today.  Check back as needed.   PDMP not reviewed this encounter. Orders Placed This Encounter  Procedures   Ambulatory referral to Physical Therapy    Referral Priority:   Routine    Referral Type:   Physical Medicine    Referral Reason:   Specialty Services Required    Requested Specialty:   Physical Therapy    Number of Visits Requested:   1   Meds ordered this encounter  Medications   tiZANidine (ZANAFLEX) 4 MG tablet    Sig: Take 1 tablet (4 mg total) by mouth every 8 (eight) hours as needed for muscle spasms.    Dispense:  60 tablet    Refill:  1     Discussed warning signs or symptoms. Please see discharge instructions. Patient expresses understanding.   The above documentation has been reviewed and is accurate and complete Lynne Leader, M.D.

## 2023-04-07 DIAGNOSIS — L729 Follicular cyst of the skin and subcutaneous tissue, unspecified: Secondary | ICD-10-CM | POA: Diagnosis not present

## 2023-04-21 ENCOUNTER — Emergency Department (HOSPITAL_COMMUNITY): Payer: BC Managed Care – PPO

## 2023-04-21 ENCOUNTER — Emergency Department (HOSPITAL_COMMUNITY)
Admission: EM | Admit: 2023-04-21 | Discharge: 2023-04-21 | Disposition: A | Discharge status: Home / Self Care | Payer: BC Managed Care – PPO | Attending: Emergency Medicine | Admitting: Emergency Medicine

## 2023-04-21 ENCOUNTER — Encounter (HOSPITAL_COMMUNITY): Payer: Self-pay

## 2023-04-21 DIAGNOSIS — I471 Supraventricular tachycardia, unspecified: Secondary | ICD-10-CM

## 2023-04-21 DIAGNOSIS — R000 Tachycardia, unspecified: Secondary | ICD-10-CM | POA: Diagnosis not present

## 2023-04-21 DIAGNOSIS — R002 Palpitations: Secondary | ICD-10-CM | POA: Diagnosis not present

## 2023-04-21 DIAGNOSIS — R0602 Shortness of breath: Secondary | ICD-10-CM | POA: Diagnosis not present

## 2023-04-21 LAB — CBC
HCT: 42.6 % (ref 39.0–52.0)
Hemoglobin: 14.9 g/dL (ref 13.0–17.0)
MCH: 30 pg (ref 26.0–34.0)
MCHC: 35 g/dL (ref 30.0–36.0)
MCV: 85.7 fL (ref 80.0–100.0)
Platelets: 152 10*3/uL (ref 150–400)
RBC: 4.97 MIL/uL (ref 4.22–5.81)
RDW: 12.9 % (ref 11.5–15.5)
WBC: 5.8 10*3/uL (ref 4.0–10.5)
nRBC: 0 % (ref 0.0–0.2)

## 2023-04-21 LAB — BASIC METABOLIC PANEL
Anion gap: 11 (ref 5–15)
BUN: 17 mg/dL (ref 6–20)
CO2: 25 mmol/L (ref 22–32)
Calcium: 8.8 mg/dL — ABNORMAL LOW (ref 8.9–10.3)
Chloride: 102 mmol/L (ref 98–111)
Creatinine, Ser: 1.28 mg/dL — ABNORMAL HIGH (ref 0.61–1.24)
GFR, Estimated: >60 mL/min (ref >60)
Glucose, Bld: 154 mg/dL — ABNORMAL HIGH (ref 70–99)
Potassium: 3.4 mmol/L — ABNORMAL LOW (ref 3.5–5.1)
Sodium: 138 mmol/L (ref 135–145)

## 2023-04-21 LAB — TSH: TSH: 2.29 u[IU]/mL (ref 0.350–4.500)

## 2023-04-21 LAB — TROPONIN I (HIGH SENSITIVITY)
Troponin I (High Sensitivity): 12 ng/L (ref <18)
Troponin I (High Sensitivity): 28 ng/L — ABNORMAL HIGH (ref <18)

## 2023-04-21 LAB — BRAIN NATRIURETIC PEPTIDE: B Natriuretic Peptide: 11.4 pg/mL (ref 0.0–100.0)

## 2023-04-21 LAB — MAGNESIUM: Magnesium: 2.4 mg/dL (ref 1.7–2.4)

## 2023-04-21 MED ORDER — METOPROLOL TARTRATE 25 MG PO TABS
12.5000 mg | ORAL_TABLET | Freq: Two times a day (BID) | ORAL | 0 refills | Status: AC
Start: 2023-04-21 — End: ?

## 2023-04-21 MED ORDER — CEPHALEXIN 500 MG PO CAPS: 500.0000 mg | ORAL_CAPSULE | Freq: Two times a day (BID) | ORAL | 0 refills | Status: AC

## 2023-04-21 NOTE — ED Provider Triage Note (Signed)
Emergency Medicine Provider Triage Evaluation Note  Carl [name] , a 57 y.o. male  was evaluated in triage.  Pt complains of palpitations.  Patient had onset of racing heart and feeling funny about an hour and a half ago.  He checked his Apple Watch and saw his heart rate was in the 140s.  His Apple Watch said that he was in atrial fibrillation.  He has no history of the same.  He had 1 alcoholic beverage today.  He does not drink regularly.  He has been taking a lot of Advil for back issue. Patient was feeling somewhat better and it look like his heart rate went back to normal while waiting however while waiting in triage she had recurrence of A-fib with RVR.  He is now feeling extremely short of breath, pale.  In triage his heart rate regulated again and he looked to be back in normal sinus rhythm at a rate of 60.  Review of Systems  Positive: Chest pain Negative: Fever  Physical Exam  BP 112/84 (BP Location: Left Arm)   Pulse (!) 111   Temp 99 F (37.2 C) (Oral)   Resp 20   SpO2 97%  Gen:   Awake, no distress   Resp:  Normal effort  MSK:   Moves extremities without difficulty  Other:  Being heavily, gray, diaphoretic  Medical Decision Making  Medically screening exam initiated at 8:40 PM.  Appropriate orders placed.  Nima Sill was informed that the remainder of the evaluation will be completed by another provider, this initial triage assessment does not replace that evaluation, and the importance of remaining in the ED until their evaluation is complete.     Arthor Captain, PA-C 04/21/23 2045

## 2023-04-21 NOTE — ED Provider Notes (Addendum)
[facility] DEPARTMENT AT [facility] Provider Note   CSN: [id number] Arrival date & time: 04/21/23  2019  History Chief Complaint  Patient presents with   Palpitations    HPI Carl [name] is a 57 y.o. male presenting for chief complaint of palpitations.  States that he was at dinner tonight when he felt his heart start racing.  His Apple Watch read a heart rate of 160.  He gave it an hour try drinking water before finally, the hospital and then on arrival heart rate he felt immediately improved while he was in triage.  Heart rate back down to the 90s while here.  He denies any chest pain shortness of breath fever fatigue near syncope during the event. No history of similar otherwise no medical history.  Was recently diagnosed with a thoracic wall cyst that became infected and had an incision and drainage with purulent expression.  Now has some overlying erythema of approximately 0.5 cm. Denies systemic symptoms to accompany this and otherwise asymptomatic at this time.   Patient's recorded medical, surgical, social, medication list and allergies were reviewed in the Snapshot window as part of the initial history.   Review of Systems   Review of Systems  Constitutional:  Negative for chills and fever.  HENT:  Negative for ear pain and sore throat.   Eyes:  Negative for pain and visual disturbance.  Respiratory:  Negative for cough and shortness of breath.   Cardiovascular:  Negative for chest pain and palpitations.  Gastrointestinal:  Negative for abdominal pain and vomiting.  Genitourinary:  Negative for dysuria and hematuria.  Musculoskeletal:  Negative for arthralgias and back pain.  Skin:  Positive for rash. Negative for color change.  Neurological:  Negative for seizures and syncope.  All other systems reviewed and are negative.   Physical Exam Updated Vital Signs BP 112/84 (BP Location: Left Arm)   Pulse (!) 111   Temp 99 F (37.2 C) (Oral)   Resp 20    SpO2 97%  Physical Exam Vitals and nursing note reviewed.  Constitutional:      General: He is not in acute distress.    Appearance: He is well-developed.  HENT:     Head: Normocephalic and atraumatic.  Eyes:     Conjunctiva/sclera: Conjunctivae normal.  Cardiovascular:     Rate and Rhythm: Normal rate and regular rhythm.     Heart sounds: No murmur heard. Pulmonary:     Effort: Pulmonary effort is normal. No respiratory distress.     Breath sounds: Normal breath sounds.  Abdominal:     Palpations: Abdomen is soft.     Tenderness: There is no abdominal tenderness.  Musculoskeletal:        General: Deformity and signs of injury present. No swelling.     Cervical back: Neck supple.  Skin:    General: Skin is warm and dry.     Capillary Refill: Capillary refill takes less than 2 seconds.  Neurological:     Mental Status: He is alert.  Psychiatric:        Mood and Affect: Mood normal.      ED Course/ Medical Decision Making/ A&P    Procedures Procedures   Medications Ordered in ED Medications - No data to display  Medical Decision Making:    [name] is a 57 y.o. male who presented to the ED today with a chief complaint of palpitations detailed above.     Complete initial physical exam performed,  notably the patient  was HDS in NAD.      Reviewed and confirmed nursing documentation for past medical history, family history, social history.    Initial Assessment:   This is most consistent with an acute life/limb threatening illness complicated by underlying chronic conditions. Patient's history of present illness and physical exam findings most consistent with paroxysmal SVT now resolved. Underlying etiology remains nonspecific.  Will evaluate for metabolic, infectious etiology.  Coincidentally he does have cellulitis around a recent inclusion cyst that was drained.  Will prescribe streptococcal coverage and recommend follow back up with his primary care provider  to ensure clearance. Initial Plan:  Screening labs including CBC and Metabolic panel to evaluate for infectious or metabolic etiology of disease.  Urinalysis with reflex culture ordered to evaluate for UTI or relevant urologic/nephrologic pathology.  CXR to evaluate for structural/infectious intrathoracic pathology.  EKG to evaluate for cardiac pathology. Objective evaluation as below reviewed with plan for close reassessment  Initial Study Results:   Laboratory  All laboratory results reviewed without evidence of clinically relevant pathology.    EKG EKG was reviewed independently. Rate, rhythm, axis, intervals all examined and without medically relevant abnormality. ST segments without concerns for elevations.    Radiology  All images reviewed independently. Agree with radiology report at this time.   DG Chest Port 1 View  Result Date: 04/21/2023 CLINICAL DATA:  Shortness of breath and palpitations EXAM: PORTABLE CHEST 1 VIEW COMPARISON:  None Available. FINDINGS: The heart size and mediastinal contours are within normal limits. Both lungs are clear. The visualized skeletal structures are unremarkable. IMPRESSION: No active disease. Electronically Signed   By: Minerva Fester M.D.   On: 04/21/2023 20:57     Reassessment and Plan:   On reassessment after 2 hours of observation in the emergency room.  Patient is having no further palpitations. Telemetry reviewed and nondiagnostic.  Will follow plan as above, contact as needed metoprolol outpatient.  Cardiology, discussed return precautions for his palpitations.  Will also treat his small area of cellulitis of his chest, recommend close follow-up with his PCP.  Clinical Impression:  1. Palpitations   2. SVT (supraventricular tachycardia)      Discharge   Final Clinical Impression(s) / ED Diagnoses Final diagnoses:  Palpitations  SVT (supraventricular tachycardia)    Rx / DC Orders ED Discharge Orders          Ordered    Amb  referral to AFIB Clinic        04/21/23 2040    Ambulatory referral to Cardiology        04/21/23 2235    metoprolol tartrate (LOPRESSOR) 25 MG tablet  2 times daily        04/21/23 2235    cephALEXin (KEFLEX) 500 MG capsule  2 times daily        04/21/23 2235              Glyn Ade, MD 04/21/23 2259   Addendum: Troponin in the indeterminate range on serial assessments.  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  Patient was without any active chest pain lightheadedness or shortness of breath and had complete resolution of symptoms.  Discussed return precautions for interval development of symptoms but this lab results alone does not warrant immediate callback or further testing and patient was already urgently referred to cardiology.   Glyn Ade, MD 04/21/23 2325

## 2023-04-21 NOTE — ED Triage Notes (Signed)
Coming in for having palpations that caused him to feel weird about 1.5hrs ago. Did the ecg on his apple watch which told him he was in afib. No reported history of afib or heart arrhythmias, not on blood thinners. Appears diaphoretic and uncomfortable in triage

## 2023-04-24 ENCOUNTER — Encounter: Payer: Self-pay | Admitting: Cardiology

## 2023-04-24 ENCOUNTER — Ambulatory Visit: Payer: BC Managed Care – PPO | Attending: Cardiology | Admitting: Cardiology

## 2023-04-24 ENCOUNTER — Telehealth: Payer: Self-pay

## 2023-04-24 ENCOUNTER — Ambulatory Visit: Payer: BC Managed Care – PPO | Attending: Cardiology

## 2023-04-24 VITALS — BP 142/75 | HR 63 | Ht 69.0 in | Wt 175.0 lb

## 2023-04-24 DIAGNOSIS — R0683 Snoring: Secondary | ICD-10-CM

## 2023-04-24 DIAGNOSIS — I480 Paroxysmal atrial fibrillation: Secondary | ICD-10-CM

## 2023-04-24 DIAGNOSIS — R030 Elevated blood-pressure reading, without diagnosis of hypertension: Secondary | ICD-10-CM

## 2023-04-24 NOTE — Patient Instructions (Addendum)
Medication Instructions:  Your physician recommends that you continue on your current medications as directed. Please refer to the Current Medication list given to you today.   Please take your blood pressure daily for 2 weeks and send in a MyChart message. Please include heart rates.   HOW TO TAKE YOUR BLOOD PRESSURE: Rest 5 minutes before taking your blood pressure. Don't smoke or drink caffeinated beverages for at least 30 minutes before. Take your blood pressure before (not after) you eat. Sit comfortably with your back supported and both feet on the floor (don't cross your legs). Elevate your arm to heart level on a table or a desk. Use the proper sized cuff. It should fit smoothly and snugly around your bare upper arm. There should be enough room to slip a fingertip under the cuff. The bottom edge of the cuff should be 1 inch above the crease of the elbow. Ideally, take 3 measurements at one sitting and record the average.  *If you need a refill on your cardiac medications before your next appointment, please call your pharmacy*   Lab Work: None   Testing/Procedures: Your physician has requested that you have an echocardiogram. Echocardiography is a painless test that uses sound waves to create images of your heart. It provides your doctor with information about the size and shape of your heart and how well your heart's chambers and valves are working. This procedure takes approximately one hour. There are no restrictions for this procedure. Please do NOT wear cologne, perfume, aftershave, or lotions (deodorant is allowed). Please arrive 15 minutes prior to your appointment time.  Your physician has recommended that you have a sleep study. This test records several body functions during sleep, including: brain activity, eye movement, oxygen and carbon dioxide blood levels, heart rate and rhythm, breathing rate and rhythm, the flow of air through your mouth and nose, snoring, body  muscle movements, and chest and belly movement.  ZIO XT- Long Term Monitor Instructions  Your physician has requested you wear a ZIO patch monitor for 14 days.  This is a single patch monitor. [employer] supplies one patch monitor per enrollment. Additional stickers are not available. Please do not apply patch if you will be having a Nuclear Stress Test,  Echocardiogram, Cardiac CT, MRI, or Chest Xray during the period you would be wearing the  monitor. The patch cannot be worn during these tests. You cannot remove and re-apply the  ZIO XT patch monitor.  Your ZIO patch monitor will be mailed 3 day USPS to your address on file. It may take 3-5 days  to receive your monitor after you have been enrolled.  Once you have received your monitor, please review the enclosed instructions. Your monitor  has already been registered assigning a specific monitor serial # to you.  Billing and Patient Assistance Program Information  We have supplied [employer] with any of your insurance information on file for billing purposes. [employer] offers a sliding scale Patient Assistance Program for patients that do not have  insurance, or whose insurance does not completely cover the cost of the ZIO monitor.  You must apply for the Patient Assistance Program to qualify for this discounted rate.  To apply, please call [employer] at [phone], select option 4, select option 2, ask to apply for  Patient Assistance Program. Meredeth Ide will ask your household income, and how many people  are in your household. They will quote your out-of-pocket cost based on that information.  [employer] will also be  able to set up a 67-month, interest-free payment plan if needed.  Applying the monitor   Shave hair from upper left chest.  Hold abrader disc by orange tab. Rub abrader in 40 strokes over the upper left chest as  indicated in your monitor instructions.  Clean area with 4 enclosed alcohol pads. Let dry.  Apply patch as indicated  in monitor instructions. Patch will be placed under collarbone on left  side of chest with arrow pointing upward.  Rub patch adhesive wings for 2 minutes. Remove white label marked "1". Remove the white  label marked "2". Rub patch adhesive wings for 2 additional minutes.  While looking in a mirror, press and release button in center of patch. A small green light will  flash 3-4 times. This will be your only indicator that the monitor has been turned on.  Do not shower for the first 24 hours. You may shower after the first 24 hours.  Press the button if you feel a symptom. You will hear a small click. Record Date, Time and  Symptom in the Patient Logbook.  When you are ready to remove the patch, follow instructions on the last 2 pages of Patient  Logbook. Stick patch monitor onto the last page of Patient Logbook.  Place Patient Logbook in the blue and white box. Use locking tab on box and tape box closed  securely. The blue and white box has prepaid postage on it. Please place it in the mailbox as  soon as possible. Your physician should have your test results approximately 7 days after the  monitor has been mailed back to [facility].  Call [facility] Customer Care at [phone] if you have questions regarding  your ZIO XT patch monitor. Call them immediately if you see an orange light blinking on your  monitor.  If your monitor falls off in less than 4 days, contact our Monitor department at [phone].  If your monitor becomes loose or falls off after 4 days call [employer] at [phone] for  suggestions on securing your monitor    Follow-Up: At [facility], you and your health needs are our priority.  As part of our continuing mission to provide you with exceptional heart care, we have created designated Provider Care Teams.  These Care Teams include your primary Cardiologist (physician) and Advanced Practice Providers (APPs -  Physician Assistants and Nurse  Practitioners) who all work together to provide you with the care you need, when you need it.  We recommend signing up for the patient portal called "MyChart".  Sign up information is provided on this After Visit Summary.  MyChart is used to connect with patients for Virtual Visits (Telemedicine).  Patients are able to view lab/test results, encounter notes, upcoming appointments, etc.  Non-urgent messages can be sent to your provider as well.   To learn more about what you can do with MyChart, go to [email or web address].    Your next appointment:   16 week(s)  Provider:   Thomasene Ripple, DO

## 2023-04-24 NOTE — Progress Notes (Unsigned)
Cardiology Office Note:    Date:  04/25/2023   ID:  Carl [name], DOB [birth year], MRN [id number]  PCP:  Irven Coe, MD  Cardiologist:  Thomasene Ripple, DO  Electrophysiologist:  None   Referring MD: Irven Coe, MD   " I am ok, had increasing palpitations "  History of Present Illness:    Carl [name] is a 57 y.o. male with medical hx of thoracic wall cyst presents today to be evaluated for palpitations.  He tells me that he was at dinner on Saturday 04/21/2023 when he started to experience increase palpitations - he noted that his heart rate was up to 160 bpm on the apple watch. It lasted for over an hour. He was then taken to the [facility] ED by the time he arrived he was back in sinus rhythm, EK was normal. Trop mildly elevated but flat. He was discharged on Lopressor as needed.   NO palpitations since his ED discharge. No chest pain and no shortness of breath.    History reviewed. No pertinent past medical history.  History reviewed. No pertinent surgical history.  Current Medications: Current Meds  Medication Sig   cephALEXin (KEFLEX) 500 MG capsule Take 1 capsule (500 mg total) by mouth 2 (two) times daily.   metoprolol tartrate (LOPRESSOR) 25 MG tablet Take 0.5 tablets (12.5 mg total) by mouth 2 (two) times daily.   tiZANidine (ZANAFLEX) 4 MG tablet Take 1 tablet (4 mg total) by mouth every 8 (eight) hours as needed for muscle spasms.     Allergies:   Patient has no known allergies.   Social History   Socioeconomic History   Marital status: Married    Spouse name: Not on file   Number of children: Not on file   Years of education: Not on file   Highest education level: Not on file  Occupational History   Not on file  Tobacco Use   Smoking status: Never   Smokeless tobacco: Never  Substance and Sexual Activity   Alcohol use: Not on file   Drug use: Not on file   Sexual activity: Not on file  Other Topics Concern   Not on file  Social History Narrative   Not on file    Social Determinants of Health   Financial Resource Strain: Not on file  Food Insecurity: Not on file  Transportation Needs: Not on file  Physical Activity: Not on file  Stress: Not on file  Social Connections: Not on file     Family History: The patient's family history is not on file.  ROS:   Review of Systems  Constitution: Negative for decreased appetite, fever and weight gain.  HENT: Negative for congestion, ear discharge, hoarse voice and sore throat.   Eyes: Negative for discharge, redness, vision loss in right eye and visual halos.  Cardiovascular: Negative for chest pain, dyspnea on exertion, leg swelling, orthopnea and palpitations.  Respiratory: Negative for cough, hemoptysis, shortness of breath and snoring.   Endocrine: Negative for heat intolerance and polyphagia.  Hematologic/Lymphatic: Negative for bleeding problem. Does not bruise/bleed easily.  Skin: Negative for flushing, nail changes, rash and suspicious lesions.  Musculoskeletal: Negative for arthritis, joint pain, muscle cramps, myalgias, neck pain and stiffness.  Gastrointestinal: Negative for abdominal pain, bowel incontinence, diarrhea and excessive appetite.  Genitourinary: Negative for decreased libido, genital sores and incomplete emptying.  Neurological: Negative for brief paralysis, focal weakness, headaches and loss of balance.  Psychiatric/Behavioral: Negative for altered mental status, depression and suicidal ideas.  Allergic/Immunologic: Negative for HIV exposure and persistent infections.    EKGs/Labs/Other Studies Reviewed:    The following studies were reviewed today:   EKG:  None today   Recent Labs: 04/21/2023: B Natriuretic Peptide 11.4; BUN 17; Creatinine, Ser 1.28; Hemoglobin 14.9; Magnesium 2.4; Platelets 152; Potassium 3.4; Sodium 138; TSH 2.290  Recent Lipid Panel No results found for: "CHOL", "TRIG", "HDL", "CHOLHDL", "VLDL", "LDLCALC", "LDLDIRECT"  Physical Exam:    VS:  BP  (!) 142/75 (BP Location: Left Arm)   Pulse 63   Ht 5\' 9"  (1.753 m)   Wt 175 lb (79.4 kg)   SpO2 99%   BMI 25.84 kg/m     Wt Readings from Last 3 Encounters:  04/24/23 175 lb (79.4 kg)  10/12/22 173 lb (78.5 kg)  03/25/21 174 lb (78.9 kg)     GEN: Well nourished, well developed in no acute distress HEENT: Normal NECK: No JVD; No carotid bruits LYMPHATICS: No lymphadenopathy CARDIAC: S1S2 noted,RRR, no murmurs, rubs, gallops RESPIRATORY:  Clear to auscultation without rales, wheezing or rhonchi  ABDOMEN: Soft, non-tender, non-distended, +bowel sounds, no guarding. EXTREMITIES: No edema, No cyanosis, no clubbing MUSCULOSKELETAL:  No deformity  SKIN: Warm and dry NEUROLOGIC:  Alert and oriented x 3, non-focal PSYCHIATRIC:  Normal affect, good insight  ASSESSMENT:    1. PAF (paroxysmal atrial fibrillation) (HCC)   2. Snoring   3. Elevated blood pressure reading    PLAN:     I reviewed his apple watch, that episode and rhythm consistent with atrial fibrillation with rvr. Discussed this with the patient - his new diagnosis of atrial fibrillation: treatment: Rate, rhythm, stroke prevention. I will keep him on the as needed Lopressor.  He will wear a monitor to understand his atrial fibrillation burden. CHADsVasc score is 0 no current indication for anticoagulation. Will get echo to assess heart structure and function. He admits to snoring, therefore in the setting of afib will order sleep study to rule out sleep apnea.  Elevated blood pressure - his blood pressure manually taken by me. We discussed he will get his blood pressure daily and send me update in 2 weeks. Should this be elevated I will start him on Cardizem 120 mg daily.   The patient is in agreement with the above plan. The patient left the office in stable condition.  The patient will follow up in   Medication Adjustments/Labs and Tests Ordered: Current medicines are reviewed at length with the patient today.  Concerns  regarding medicines are outlined above.  Orders Placed This Encounter  Procedures   LONG TERM MONITOR (3-14 DAYS)   ECHOCARDIOGRAM COMPLETE   Split night study   No orders of the defined types were placed in this encounter.   Patient Instructions  Medication Instructions:  Your physician recommends that you continue on your current medications as directed. Please refer to the Current Medication list given to you today.   Please take your blood pressure daily for 2 weeks and send in a MyChart message. Please include heart rates.   HOW TO TAKE YOUR BLOOD PRESSURE: Rest 5 minutes before taking your blood pressure. Don't smoke or drink caffeinated beverages for at least 30 minutes before. Take your blood pressure before (not after) you eat. Sit comfortably with your back supported and both feet on the floor (don't cross your legs). Elevate your arm to heart level on a table or a desk. Use the proper sized cuff. It should fit smoothly and snugly around your bare  upper arm. There should be enough room to slip a fingertip under the cuff. The bottom edge of the cuff should be 1 inch above the crease of the elbow. Ideally, take 3 measurements at one sitting and record the average.  *If you need a refill on your cardiac medications before your next appointment, please call your pharmacy*   Lab Work: None   Testing/Procedures: Your physician has requested that you have an echocardiogram. Echocardiography is a painless test that uses sound waves to create images of your heart. It provides your doctor with information about the size and shape of your heart and how well your heart's chambers and valves are working. This procedure takes approximately one hour. There are no restrictions for this procedure. Please do NOT wear cologne, perfume, aftershave, or lotions (deodorant is allowed). Please arrive 15 minutes prior to your appointment time.  Your physician has recommended that you have a  sleep study. This test records several body functions during sleep, including: brain activity, eye movement, oxygen and carbon dioxide blood levels, heart rate and rhythm, breathing rate and rhythm, the flow of air through your mouth and nose, snoring, body muscle movements, and chest and belly movement.  ZIO XT- Long Term Monitor Instructions  Your physician has requested you wear a ZIO patch monitor for 14 days.  This is a single patch monitor. [employer] supplies one patch monitor per enrollment. Additional stickers are not available. Please do not apply patch if you will be having a Nuclear Stress Test,  Echocardiogram, Cardiac CT, MRI, or Chest Xray during the period you would be wearing the  monitor. The patch cannot be worn during these tests. You cannot remove and re-apply the  ZIO XT patch monitor.  Your ZIO patch monitor will be mailed 3 day USPS to your address on file. It may take 3-5 days  to receive your monitor after you have been enrolled.  Once you have received your monitor, please review the enclosed instructions. Your monitor  has already been registered assigning a specific monitor serial # to you.  Billing and Patient Assistance Program Information  We have supplied [employer] with any of your insurance information on file for billing purposes. [employer] offers a sliding scale Patient Assistance Program for patients that do not have  insurance, or whose insurance does not completely cover the cost of the ZIO monitor.  You must apply for the Patient Assistance Program to qualify for this discounted rate.  To apply, please call [employer] at [phone], select option 4, select option 2, ask to apply for  Patient Assistance Program. Meredeth Ide will ask your household income, and how many people  are in your household. They will quote your out-of-pocket cost based on that information.  [employer] will also be able to set up a 67-month, interest-free payment plan if needed.  Applying the  monitor   Shave hair from upper left chest.  Hold abrader disc by orange tab. Rub abrader in 40 strokes over the upper left chest as  indicated in your monitor instructions.  Clean area with 4 enclosed alcohol pads. Let dry.  Apply patch as indicated in monitor instructions. Patch will be placed under collarbone on left  side of chest with arrow pointing upward.  Rub patch adhesive wings for 2 minutes. Remove white label marked "1". Remove the white  label marked "2". Rub patch adhesive wings for 2 additional minutes.  While looking in a mirror, press and release button in center of patch. A small  green light will  flash 3-4 times. This will be your only indicator that the monitor has been turned on.  Do not shower for the first 24 hours. You may shower after the first 24 hours.  Press the button if you feel a symptom. You will hear a small click. Record Date, Time and  Symptom in the Patient Logbook.  When you are ready to remove the patch, follow instructions on the last 2 pages of Patient  Logbook. Stick patch monitor onto the last page of Patient Logbook.  Place Patient Logbook in the blue and white box. Use locking tab on box and tape box closed  securely. The blue and white box has prepaid postage on it. Please place it in the mailbox as  soon as possible. Your physician should have your test results approximately 7 days after the  monitor has been mailed back to [facility].  Call [facility] Customer Care at [phone] if you have questions regarding  your ZIO XT patch monitor. Call them immediately if you see an orange light blinking on your  monitor.  If your monitor falls off in less than 4 days, contact our Monitor department at [phone].  If your monitor becomes loose or falls off after 4 days call [employer] at [phone] for  suggestions on securing your monitor    Follow-Up: At [facility], you and your health needs are our priority.  As part of  our continuing mission to provide you with exceptional heart care, we have created designated Provider Care Teams.  These Care Teams include your primary Cardiologist (physician) and Advanced Practice Providers (APPs -  Physician Assistants and Nurse Practitioners) who all work together to provide you with the care you need, when you need it.  We recommend signing up for the patient portal called "MyChart".  Sign up information is provided on this After Visit Summary.  MyChart is used to connect with patients for Virtual Visits (Telemedicine).  Patients are able to view lab/test results, encounter notes, upcoming appointments, etc.  Non-urgent messages can be sent to your provider as well.   To learn more about what you can do with MyChart, go to [email or web address].    Your next appointment:   16 week(s)  Provider:   Thomasene Ripple, DO     Adopting a Healthy Lifestyle.  Know what a healthy weight is for you (roughly BMI <25) and aim to maintain this   Aim for 7+ servings of fruits and vegetables daily   65-80+ fluid ounces of water or unsweet tea for healthy kidneys   Limit to max 1 drink of alcohol per day; avoid smoking/tobacco   Limit animal fats in diet for cholesterol and heart health - choose grass fed whenever available   Avoid highly processed foods, and foods high in saturated/trans fats   Aim for low stress - take time to unwind and care for your mental health   Aim for 150 min of moderate intensity exercise weekly for heart health, and weights twice weekly for bone health   Aim for 7-9 hours of sleep daily   When it comes to diets, agreement about the perfect plan isnt easy to find, even among the experts. Experts at the [facility] of [employer] developed an idea known as the Healthy Eating Plate. Just imagine a plate divided into logical, healthy portions.   The emphasis is on diet quality:   Load up on vegetables and fruits - one-half of your plate: Aim for  color and variety, and remember that potatoes dont count.   Go for whole grains - one-quarter of your plate: Whole wheat, barley, wheat berries, quinoa, oats, brown rice, and foods made with them. If you want pasta, go with whole wheat pasta.   Protein power - one-quarter of your plate: Fish, chicken, beans, and nuts are all healthy, versatile protein sources. Limit red meat.   The diet, however, does go beyond the plate, offering a few other suggestions.   Use healthy plant oils, such as olive, canola, soy, corn, sunflower and peanut. Check the labels, and avoid partially hydrogenated oil, which have unhealthy trans fats.   If youre thirsty, drink water. Coffee and tea are good in moderation, but skip sugary drinks and limit milk and dairy products to one or two daily servings.   The type of carbohydrate in the diet is more important than the amount. Some sources of carbohydrates, such as vegetables, fruits, whole grains, and beans-are healthier than others.   Finally, stay active  Signed, Thomasene Ripple, DO  04/25/2023 9:15 AM    [facility]

## 2023-04-24 NOTE — Progress Notes (Unsigned)
Enrolled for [employer] to mail a ZIO XT long term holter monitor to the patients address on file.  

## 2023-04-24 NOTE — Telephone Encounter (Unsigned)
Called pt to see if he could come in sooner, no answer. Left message for him to return the call.

## 2023-04-25 ENCOUNTER — Encounter: Payer: Self-pay | Admitting: Cardiology

## 2023-04-25 DIAGNOSIS — I480 Paroxysmal atrial fibrillation: Secondary | ICD-10-CM | POA: Insufficient documentation

## 2023-04-25 DIAGNOSIS — R030 Elevated blood-pressure reading, without diagnosis of hypertension: Secondary | ICD-10-CM | POA: Insufficient documentation

## 2023-04-25 DIAGNOSIS — R0683 Snoring: Secondary | ICD-10-CM | POA: Insufficient documentation

## 2023-04-27 DIAGNOSIS — I480 Paroxysmal atrial fibrillation: Secondary | ICD-10-CM

## 2023-05-11 ENCOUNTER — Ambulatory Visit (HOSPITAL_COMMUNITY): Payer: BC Managed Care – PPO | Attending: Cardiology

## 2023-05-11 DIAGNOSIS — I480 Paroxysmal atrial fibrillation: Secondary | ICD-10-CM

## 2023-05-11 LAB — ECHOCARDIOGRAM COMPLETE
Area-P 1/2: 3.95 cm2
S' Lateral: 2.8 cm

## 2023-06-26 ENCOUNTER — Encounter: Payer: Self-pay | Admitting: Cardiology

## 2023-06-26 ENCOUNTER — Ambulatory Visit: Payer: BC Managed Care – PPO | Attending: Internal Medicine | Admitting: Cardiology

## 2023-06-26 VITALS — BP 119/84 | HR 65 | Ht 69.0 in | Wt 174.0 lb

## 2023-06-26 DIAGNOSIS — I471 Supraventricular tachycardia, unspecified: Secondary | ICD-10-CM

## 2023-06-26 DIAGNOSIS — R0683 Snoring: Secondary | ICD-10-CM

## 2023-06-26 DIAGNOSIS — I4729 Other ventricular tachycardia: Secondary | ICD-10-CM

## 2023-06-26 DIAGNOSIS — I480 Paroxysmal atrial fibrillation: Secondary | ICD-10-CM | POA: Diagnosis not present

## 2023-06-26 MED ORDER — METOPROLOL SUCCINATE ER 25 MG PO TB24: 12.5000 mg | ORAL_TABLET | Freq: Every day | ORAL | 3 refills | Status: DC

## 2023-06-26 NOTE — Progress Notes (Signed)
Virtual Visit via Video Note   Because of Carl [name]'s co-morbid illnesses, he is at least at moderate risk for complications without adequate follow up.  This format is felt to be most appropriate for this patient at this time.  All issues noted in this document were discussed and addressed.  A limited physical exam was performed with this format.  Please refer to the patient's chart for his consent to telehealth for [facility].      Date:  06/26/2023   ID:  Carl [name], DOB [birth year], MRN [id number]  Patient Location: Home Provider Location: Office/Clinic   Virtual Visit via Video  Note . I connected with the patient today by a   video enabled telemedicine application and verified that I am speaking with the correct person using two identifiers.   PCP:  Irven Coe, MD  Cardiologist:  Thomasene Ripple, DO  Electrophysiologist:  None   Evaluation Performed:  Follow-Up Visit  Chief Complaint:  " I am doing well"  History of Present Illness:    Carl [name] is a 57 y.o. male with history of paroxysmal atrial fibrillation, with recent monitor showing NSVT and PSVT.  Here for no specific complaints at this time.  The patient does not have symptoms concerning for COVID-19 infection (fever, chills, cough, or new shortness of breath).    No past medical history on file. No past surgical history on file.   Current Meds  Medication Sig   metoprolol succinate (TOPROL XL) 25 MG 24 hr tablet Take 0.5 tablets (12.5 mg total) by mouth daily.     Allergies:   Patient has no known allergies.   Social History   Tobacco Use   Smoking status: Never   Smokeless tobacco: Never     Family Hx: The patient's family history is not on file.  ROS:   Please see the history of present illness.    Lattie saw him he made came changed unless if you have taken the patient is usually the one as closely All other systems reviewed and are negative.   Prior CV studies:   The  following studies were reviewed today:  Zio monitor and echo  Labs/Other Tests and Data Reviewed:    EKG:  None toaday   Recent Labs: 04/21/2023: B Natriuretic Peptide 11.4; BUN 17; Creatinine, Ser 1.28; Hemoglobin 14.9; Magnesium 2.4; Platelets 152; Potassium 3.4; Sodium 138; TSH 2.290   Recent Lipid Panel No results found for: "CHOL", "TRIG", "HDL", "CHOLHDL", "LDLCALC", "LDLDIRECT"  Wt Readings from Last 3 Encounters:  06/26/23 174 lb (78.9 kg)  04/24/23 175 lb (79.4 kg)  10/12/22 173 lb (78.5 kg)     Objective:    Vital Signs:  BP 119/84 (BP Location: Right Arm, Patient Position: Sitting, Cuff Size: Normal)   Pulse 65   Ht 5\' 9"  (1.753 m)   Wt 174 lb (78.9 kg)   BMI 25.70 kg/m      ASSESSMENT & PLAN:    PAF NSVT PSVT Second-degree type I Wenckebach (snoring)  We discussed the monitor result.  At this time we will stop Lopressor start the patient on Toprol-XL 12.5 mg daily.  He had not taking any of the Lopressor.  I have asked the patient will be at his bedside him try to start low-dose beta-blocker. He needs a sleep study.  His STOP-BANG score is 3 indicating high probability for sleep apnea.  We discussed his echocardiogram result.  COVID-19 Education: The signs and symptoms of COVID-19 were  discussed with the patient and how to seek care for testing (follow up with PCP or arrange E-visit).  The importance of social distancing was discussed today.  Time:   Today, I have spent 14 minutes with the patient with telehealth technology discussing the above problems.     Medication Adjustments/Labs and Tests Ordered: Current medicines are reviewed at length with the patient today.  Concerns regarding medicines are outlined above.   Tests Ordered: Orders Placed This Encounter  Procedures   [place] Sleep Study    Medication Changes: Meds ordered this encounter  Medications   metoprolol succinate (TOPROL XL) 25 MG 24 hr tablet    Sig: Take 0.5 tablets (12.5 mg  total) by mouth daily.    Dispense:  45 tablet    Refill:  3    Follow Up:  In Person in 6 month(s)  Signed, Thomasene Ripple, DO  06/26/2023 1:54 PM    [facility]

## 2023-06-26 NOTE — Patient Instructions (Signed)
Medication Instructions:  Your physician has recommended you make the following change in your medication:  STOP: Metoprolol tartrate (Lopressor)  START: Metoprolol succinate (Toprol-XL) 12.5 mg once daily  *If you need a refill on your cardiac medications before your next appointment, please call your pharmacy*   Lab Work: None   Testing/Procedures: Your physician has recommended that you have a sleep study. This test records several body functions during sleep, including: brain activity, eye movement, oxygen and carbon dioxide blood levels, heart rate and rhythm, breathing rate and rhythm, the flow of air through your mouth and nose, snoring, body muscle movements, and chest and belly movement.   Follow-Up: At [facility], you and your health needs are our priority.  As part of our continuing mission to provide you with exceptional heart care, we have created designated Provider Care Teams.  These Care Teams include your primary Cardiologist (physician) and Advanced Practice Providers (APPs -  Physician Assistants and Nurse Practitioners) who all work together to provide you with the care you need, when you need it.   Your next appointment:   6 month(s)  Provider:   Thomasene Ripple, DO

## 2023-06-30 DIAGNOSIS — Z13220 Encounter for screening for lipoid disorders: Secondary | ICD-10-CM | POA: Diagnosis not present

## 2023-06-30 DIAGNOSIS — Z23 Encounter for immunization: Secondary | ICD-10-CM | POA: Diagnosis not present

## 2023-06-30 DIAGNOSIS — Z1159 Encounter for screening for other viral diseases: Secondary | ICD-10-CM | POA: Diagnosis not present

## 2023-06-30 DIAGNOSIS — Z125 Encounter for screening for malignant neoplasm of prostate: Secondary | ICD-10-CM | POA: Diagnosis not present

## 2023-06-30 DIAGNOSIS — Z0000 Encounter for general adult medical examination without abnormal findings: Secondary | ICD-10-CM | POA: Diagnosis not present

## 2023-06-30 DIAGNOSIS — I480 Paroxysmal atrial fibrillation: Secondary | ICD-10-CM | POA: Diagnosis not present

## 2023-07-12 ENCOUNTER — Telehealth: Payer: Self-pay

## 2023-07-12 NOTE — Telephone Encounter (Signed)
**Note De-Identified [name] Obfuscation** Ordering provider: Dr Servando Salina Associated diagnoses: Snoring-R06.83 WatchPAT PA obtained on 07/12/2023 by Janie Capp, Lorelle Formosa, LPN Annjanette Wertenberger phone call with Carl [name] at [employer] Order #: [id number] Patient notified of PIN (1234) on 07/12/2023 [name] Notification Method: phone. I did request that he do his HST within 2 weeks, if possible and he stated that he will.  Phone note routed to covering staff for follow-up.

## 2023-07-18 DIAGNOSIS — L723 Sebaceous cyst: Secondary | ICD-10-CM | POA: Diagnosis not present

## 2023-07-25 ENCOUNTER — Encounter (INDEPENDENT_AMBULATORY_CARE_PROVIDER_SITE_OTHER): Payer: Self-pay | Admitting: Cardiology

## 2023-07-25 DIAGNOSIS — I480 Paroxysmal atrial fibrillation: Secondary | ICD-10-CM

## 2023-07-25 DIAGNOSIS — R0683 Snoring: Secondary | ICD-10-CM

## 2023-07-26 ENCOUNTER — Telehealth: Payer: Self-pay

## 2023-07-26 ENCOUNTER — Ambulatory Visit: Payer: BC Managed Care – PPO | Attending: Cardiology

## 2023-07-26 DIAGNOSIS — I480 Paroxysmal atrial fibrillation: Secondary | ICD-10-CM

## 2023-07-26 DIAGNOSIS — R0683 Snoring: Secondary | ICD-10-CM

## 2023-07-26 NOTE — Telephone Encounter (Signed)
-----   Message from Armanda Magic sent at 07/26/2023  9:03 AM EDT ----- Please let patient know that sleep study showed no significant sleep apnea.

## 2023-07-26 NOTE — Procedures (Signed)
SLEEP STUDY REPORT Patient Information Study Date: 07/25/2023 Patient Name: Carl [name] Patient ID: [id number] Birth Date: 1966/01/14 Age: 57 Gender: Male BMI: 25.8 (W=174 lb, H=5' 9'') Referring Physician: Thomasene Ripple, DO  TEST DESCRIPTION: Home sleep apnea testing was completed using the WatchPat, a Type 1 device, utilizing  peripheral arterial tonometry (PAT), chest movement, actigraphy, pulse oximetry, pulse rate, body position and snore.  AHI was calculated with apnea and hypopnea using valid sleep time as the denominator. RDI includes apneas,  hypopneas, and RERAs. The data acquired and the scoring of sleep and all associated events were performed in  accordance with the recommended standards and specifications as outlined in the AASM Manual for the Scoring of  Sleep and Associated Events 2.2.0 (2015).   FINDINGS: 1. No evidence of Obstructive Sleep Apnea with AHI 0.7/hr.  2. No Central Sleep Apnea. 3. Oxygen desaturations as low as 93%. 4. Mild to moderate snoring was present. O2 sats were < 88% for 0 minutes. 5. Total sleep time was 7 hrs and 4 min. 6. 17.3% of total sleep time was spent in REM sleep.  7. Normal sleep onset latency at 19 min.  8. Shortened REM sleep onset latency at 49 min.  9. Total awakenings were 6.   DIAGNOSIS:  Normal study with no significant sleep disordered breathing.  RECOMMENDATIONS: 1. Normal study with no significant sleep disordered breathing.  2. Healthy sleep recommendations include: adequate nightly sleep (normal 7-9 hrs/night), avoidance of caffeine after  noon and alcohol near bedtime, and maintaining a sleep environment that is cool, dark and quiet.  3. Weight loss for overweight patients is recommended.   4. Snoring recommendations include: weight loss where appropriate, side sleeping, and avoidance of alcohol before  bed.  5. Operation of motor vehicle or dangerous equipment must be avoided when feeling drowsy, excessively  sleepy, or  mentally fatigued.   6. An ENT consultation which may be useful for specific causes of and possible treatment of bothersome snoring .   7. Weight loss may be of benefit in reducing the severity of snoring.   Signature: Armanda Magic, MD; [facility]; Diplomat, American Board of Sleep  Medicine Electronically Signed: 07/26/2023 9:01:19 AM

## 2023-07-26 NOTE — Telephone Encounter (Signed)
Notified patient of sleep study results and recommendations. All questions were answered and patient verbalized understanding.

## 2023-07-28 NOTE — Telephone Encounter (Signed)
Spoke with pt via MyChart. He completed the test after we spoke. See chart.

## 2023-08-15 ENCOUNTER — Ambulatory Visit: Payer: BC Managed Care – PPO | Admitting: Cardiology

## 2023-11-09 ENCOUNTER — Encounter: Payer: Self-pay | Admitting: Cardiology

## 2023-11-09 MED ORDER — METOPROLOL SUCCINATE ER 25 MG PO TB24: 12.5000 mg | ORAL_TABLET | Freq: Every day | ORAL | 3 refills | Status: DC

## 2023-12-27 ENCOUNTER — Encounter: Payer: Self-pay | Admitting: Physician Assistant

## 2023-12-27 ENCOUNTER — Ambulatory Visit: Attending: Physician Assistant | Admitting: Physician Assistant

## 2023-12-27 VITALS — BP 126/78 | HR 54 | Ht 69.0 in | Wt 179.0 lb

## 2023-12-27 DIAGNOSIS — I480 Paroxysmal atrial fibrillation: Secondary | ICD-10-CM

## 2023-12-27 DIAGNOSIS — I441 Atrioventricular block, second degree: Secondary | ICD-10-CM | POA: Diagnosis not present

## 2023-12-27 NOTE — Progress Notes (Signed)
 Cardiology Office Note:  .   Date:  12/27/2023  ID:  Carl [name], DOB [birth year], MRN [id number] PCP: Irven Coe, MD  [facility] Providers Cardiologist:  Thomasene Ripple, DO     History of Present Illness: Marland Kitchen   Carl [name] is a 58 y.o. male with prior history of thoracic wall cyst and palpitation.  He has been followed by Dr. Servando Salina for his palpitation.  He had an episode in July 2024 with his heart rate went up to 160s based on Apple Watch recording.  The symptom lasted over an hour.  Patient was seen in the ED.  Troponin was mildly elevated but flat.  He was discharged on Lopressor as needed.  Dr. Servando Salina reviewed the patient's Apple Watch recording, the rhythm was consistent with atrial fibrillation with RVR.  His CHA2DS2-Vasc score was 0, therefore he was not placed on anticoagulation therapy.  Given history of snoring, a sleep study was recommended.  Echocardiogram obtained on 05/11/2023 showed EF 65 to 70%, no regional wall motion abnormality, mild MR, otherwise no significant valve issue.  Heart monitor performed in August 2024 showed minimal heart rate of 34 bpm, maximal heart rate of 207 bpm, average heart rate 70 bpm.  2 episodes of ventricular tachycardia lasting only 4 beats.  3 SVT with the longest interval 17 beats.  Second-degree A-V block Mobitz 1 rhythm was present.  He was seen by Dr. Servando Salina via virtual visit on 06/26/2023, given the finding on the heart monitor, he was started on metoprolol succinate 12.5 mg daily.  Carl [name] was negative for OSA or central sleep aspnea. Oxygen desat down to 93%.  Patient presents today for follow-up.  He denies any chest pain or shortness of breath.  He has not had any recurrence of atrial fibrillation since last year.  He has no lower extremity edema, orthopnea or PND.  He has not had any significant dizziness, blurry vision or feeling of passing out that would suggest advanced AV block.  He can follow-up with Dr. Servando Salina in 9 months.  ROS:   He denies chest  pain, palpitations, dyspnea, pnd, orthopnea, n, v, dizziness, syncope, edema, weight gain, or early satiety. All other systems reviewed and are otherwise negative except as noted above.    Studies Reviewed: .        Cardiac Studies & Procedures   ______________________________________________________________________________________________     ECHOCARDIOGRAM  ECHOCARDIOGRAM COMPLETE 05/11/2023  Narrative ECHOCARDIOGRAM REPORT    [name]   Date of Exam: 05/11/2023 Medical Rec #:  [id number]     Height:       69.0 in Accession #:    [id number]    Weight:       175.0 lb Date of Birth:  [birth year]    BSA:          1.952 m Patient Age:    56 years      BP:           142/75 mmHg Patient Gender: M             HR:           66 bpm. Exam Location:  [address]  Procedure: 2D Echo, 3D Echo, Cardiac Doppler, Color Doppler and Strain Analysis  Indications:    I48.91 Atrial Fibrillation  History:        Patient has no prior history of Echocardiogram examinations. Arrythmias:Atrial Fibrillation, Signs/Symptoms:Dizziness/Lightheadedness; Risk Factors:Family History of Coronary Artery Disease. Palpitations.  Sonographer:  Bethany Mcmahill RDCS Referring Phys: Carl [name]  IMPRESSIONS   1. Left ventricular ejection fraction, by estimation, is 65 to 70%. The left ventricle has normal function. The left ventricle has no regional wall motion abnormalities. Left ventricular diastolic parameters were normal. The average left ventricular global longitudinal strain is -19.8 %. The global longitudinal strain is normal. 2. Right ventricular systolic function is normal. The right ventricular size is normal. 3. The mitral valve is normal in structure. Mild mitral valve regurgitation. 4. The aortic valve is normal in structure. Aortic valve regurgitation is not visualized.  FINDINGS Left Ventricle: Left ventricular ejection fraction, by estimation, is 65 to 70%. The left  ventricle has normal function. The left ventricle has no regional wall motion abnormalities. The average left ventricular global longitudinal strain is -19.8 %. The global longitudinal strain is normal. The left ventricular internal cavity size was normal in size. There is no left ventricular hypertrophy. Left ventricular diastolic parameters were normal.  Right Ventricle: The right ventricular size is normal. Right vetricular wall thickness was not assessed. Right ventricular systolic function is normal.  Left Atrium: Left atrial size was normal in size.  Right Atrium: Right atrial size was normal in size.  Pericardium: There is no evidence of pericardial effusion.  Mitral Valve: The mitral valve is normal in structure. Mild mitral valve regurgitation.  Tricuspid Valve: The tricuspid valve is normal in structure. Tricuspid valve regurgitation is trivial.  Aortic Valve: The aortic valve is normal in structure. Aortic valve regurgitation is not visualized.  Pulmonic Valve: The pulmonic valve was normal in structure. Pulmonic valve regurgitation is mild.  Aorta: The aortic root and ascending aorta are structurally normal, with no evidence of dilitation.  IAS/Shunts: No atrial level shunt detected by color flow Doppler.   LEFT VENTRICLE PLAX 2D LVIDd:         4.90 cm   Diastology LVIDs:         2.80 cm   LV e' medial:    9.79 cm/s LV PW:         0.80 cm   LV E/e' medial:  7.7 LV IVS:        0.90 cm   LV e' lateral:   13.40 cm/s LVOT diam:     2.10 cm   LV E/e' lateral: 5.6 LV SV:         71 LV SV Index:   36        2D Longitudinal Strain LVOT Area:     3.46 cm  2D Strain GLS (A2C):   -20.3 % 2D Strain GLS (A3C):   -21.3 % 2D Strain GLS (A4C):   -17.8 % 2D Strain GLS Avg:     -19.8 %  3D Volume EF: 3D EF:        67 % LV EDV:       89 ml LV ESV:       29 ml LV SV:        60 ml  RIGHT VENTRICLE RV Basal diam:  3.70 cm RV S prime:     15.70 cm/s TAPSE (M-mode): 2.5 cm RVSP:            26.0 mmHg  LEFT ATRIUM             Index        RIGHT ATRIUM           Index LA diam:        3.90 cm 2.00 cm/m  RA Pressure: 3.00 mmHg LA Vol (A2C):   45.6 ml 23.36 ml/m  RA Area:     16.10 cm LA Vol (A4C):   49.6 ml 25.41 ml/m  RA Volume:   43.50 ml  22.28 ml/m LA Biplane Vol: 49.2 ml 25.20 ml/m AORTIC VALVE LVOT Vmax:   96.77 cm/s LVOT Vmean:  64.300 cm/s LVOT VTI:    0.204 m  AORTA Ao Root diam: 2.70 cm Ao Asc diam:  3.00 cm  MITRAL VALVE               TRICUSPID VALVE MV Area (PHT): cm         TR Peak grad:   23.0 mmHg MV Decel Time: 192 msec    TR Vmax:        240.00 cm/s MV E velocity: 75.45 cm/s  Estimated RAP:  3.00 mmHg MV A velocity: 67.35 cm/s  RVSP:           26.0 mmHg MV E/A ratio:  1.12 SHUNTS Systemic VTI:  0.20 m Systemic Diam: 2.10 cm  Dietrich Pates MD Electronically signed by Dietrich Pates MD Signature Date/Time: 05/11/2023/6:29:57 PM    Final    MONITORS  LONG TERM MONITOR (3-14 DAYS) 05/15/2023  Narrative Patch Wear Time:  12 days and 1 hours (2024-08-01T10:11:06-399 to 2024-08-13T11:17:02-399)  Patient had a min HR of 34 bpm, max HR of 207 bpm, and avg HR of 70 bpm. Predominant underlying rhythm was Sinus Rhythm. 2 Ventricular Tachycardia runs occurred, the run with the fastest interval lasting 5 beats with a max rate of 207 bpm, the longest lasting 4 beats with an avg rate of 112 bpm. 3 Supraventricular Tachycardia runs occurred, the run with the fastest interval lasting 6 beats with a max rate of 143 bpm, the longest lasting 17 beats with an avg rate of 117 bpm. Second Degree AV Block-Mobitz I (Wenckebach) was present. Isolated SVEs were rare (<1.0%), SVE Couplets were rare (<1.0%), and SVE Triplets were rare (<1.0%). Isolated VEs were rare (<1.0%), and no VE Couplets or VE Triplets were present.  Symptoms associated with sinus tachycardia and premature atrial complex.  Impression: Nonsustained ventricular tachycardia Rare paroxysmal  supraventricular tachycardia Mobitz type I/Wenckebach       ______________________________________________________________________________________________      Risk Assessment/Calculations:             Physical Exam:   VS:  BP 126/78 (BP Location: Left Arm, Patient Position: Sitting, Cuff Size: Normal)   Pulse (!) 54   Ht 5\' 9"  (1.753 m)   Wt 179 lb (81.2 kg)   SpO2 99%   BMI 26.43 kg/m    Wt Readings from Last 3 Encounters:  12/27/23 179 lb (81.2 kg)  06/26/23 174 lb (78.9 kg)  04/24/23 175 lb (79.4 kg)    GEN: Well nourished, well developed in no acute distress NECK: No JVD; No carotid bruits CARDIAC: RRR, no murmurs, rubs, gallops RESPIRATORY:  Clear to auscultation without rales, wheezing or rhonchi  ABDOMEN: Soft, non-tender, non-distended EXTREMITIES:  No edema; No deformity   ASSESSMENT AND PLAN: .    Paroxysmal atrial fibrillation with RVR: Solitary episode that occurred in 2024.  No recurrence since then.  Continue Toprol-XL 12.5 mg daily.  History of Mobitz 1: Seen on heart monitor.  No dizziness, blurry vision or feeling of passing out.       Dispo: Follow-up in 9 months with Dr. Servando Salina  Signed, Azalee Course, [place]

## 2023-12-27 NOTE — Patient Instructions (Signed)
 Medication Instructions:  NO CHANGES *If you need a refill on your cardiac medications before your next appointment, please call your pharmacy*  Lab Work: NO LABS If you have labs (blood work) drawn today and your tests are completely normal, you will receive your results only by: MyChart Message (if you have MyChart) OR A paper copy in the mail If you have any lab test that is abnormal or we need to change your treatment, we will call you to review the results.  Testing/Procedures: NO TESTING  Follow-Up: At [facility], you and your health needs are our priority.  As part of our continuing mission to provide you with exceptional heart care, our providers are all part of one team.  This team includes your primary Cardiologist (physician) and Advanced Practice Providers or APPs (Physician Assistants and Nurse Practitioners) who all work together to provide you with the care you need, when you need it.  Your next appointment:   9 month(s)  Provider:   Thomasene Ripple, DO   Other Instructions   1st Floor: - Lobby - Registration  - Pharmacy  - Lab - Cafe  2nd Floor: - PV Lab - Diagnostic Testing (echo, CT, nuclear med)  3rd Floor: - Vacant  4th Floor: - TCTS (cardiothoracic surgery) - AFib Clinic - Structural Heart Clinic - Vascular Surgery  - Vascular Ultrasound  5th Floor: - [facility] (general and EP) - Clinical Pharmacy for coumadin, hypertension, lipid, weight-loss medications, and med management appointments    Valet parking services will be available as well.

## 2024-05-28 ENCOUNTER — Other Ambulatory Visit: Payer: Self-pay | Admitting: Cardiology

## 2024-07-02 ENCOUNTER — Encounter: Payer: Self-pay | Admitting: Cardiology

## 2024-08-11 ENCOUNTER — Encounter: Payer: Self-pay | Admitting: Cardiology

## 2024-08-12 MED ORDER — METOPROLOL SUCCINATE ER 25 MG PO TB24: 12.5000 mg | ORAL_TABLET | Freq: Every day | ORAL | 1 refills | Status: DC

## 2024-08-30 ENCOUNTER — Encounter: Payer: Self-pay | Admitting: Cardiology

## 2024-09-03 MED ORDER — DILTIAZEM HCL ER COATED BEADS 120 MG PO CP24: 120.0000 mg | ORAL_CAPSULE | Freq: Every day | ORAL | 3 refills | Status: AC

## 2024-09-03 NOTE — Addendum Note (Signed)
 Addended by: JOSHUA ANDREZ PARAS on: 09/03/2024 02:34 PM   Modules accepted: Orders
